# Patient Record
Sex: Male | Born: 1955 | Race: White | Hispanic: No | Marital: Married | Smoking: Former smoker
Health system: Southern US, Community
[De-identification: ages and names within clinical notes are randomized; demographics above are authoritative.]

## PROBLEM LIST (undated history)

## (undated) DIAGNOSIS — I519 Heart disease, unspecified: Secondary | ICD-10-CM

## (undated) DIAGNOSIS — F419 Anxiety disorder, unspecified: Secondary | ICD-10-CM

## (undated) DIAGNOSIS — R519 Headache, unspecified: Secondary | ICD-10-CM

## (undated) DIAGNOSIS — R51 Headache: Secondary | ICD-10-CM

## (undated) DIAGNOSIS — J45909 Unspecified asthma, uncomplicated: Secondary | ICD-10-CM

## (undated) DIAGNOSIS — F329 Major depressive disorder, single episode, unspecified: Secondary | ICD-10-CM

## (undated) DIAGNOSIS — F32A Depression, unspecified: Secondary | ICD-10-CM

## (undated) DIAGNOSIS — R569 Unspecified convulsions: Secondary | ICD-10-CM

## (undated) DIAGNOSIS — J984 Other disorders of lung: Secondary | ICD-10-CM

## (undated) DIAGNOSIS — I38 Endocarditis, valve unspecified: Secondary | ICD-10-CM

## (undated) DIAGNOSIS — J449 Chronic obstructive pulmonary disease, unspecified: Secondary | ICD-10-CM

## (undated) HISTORY — PX: NO PAST SURGERIES: SHX2092

## (undated) HISTORY — DX: Anxiety disorder, unspecified: F41.9

## (undated) HISTORY — DX: Headache: R51

## (undated) HISTORY — DX: Major depressive disorder, single episode, unspecified: F32.9

## (undated) HISTORY — DX: Depression, unspecified: F32.A

## (undated) HISTORY — DX: Headache, unspecified: R51.9

## (undated) HISTORY — DX: Heart disease, unspecified: I51.9

---

## 2015-05-23 DIAGNOSIS — R569 Unspecified convulsions: Secondary | ICD-10-CM

## 2015-05-23 HISTORY — DX: Unspecified convulsions: R56.9

## 2015-07-05 ENCOUNTER — Observation Stay (HOSPITAL_COMMUNITY): Payer: Medicaid Other

## 2015-07-05 ENCOUNTER — Emergency Department (HOSPITAL_COMMUNITY): Payer: Medicaid Other

## 2015-07-05 ENCOUNTER — Encounter (HOSPITAL_COMMUNITY): Payer: Self-pay | Admitting: *Deleted

## 2015-07-05 ENCOUNTER — Inpatient Hospital Stay (HOSPITAL_COMMUNITY)
Admission: EM | Admit: 2015-07-05 | Discharge: 2015-07-09 | DRG: 871 | Disposition: A | Discharge status: Home / Self Care | Payer: Medicaid Other | Attending: Internal Medicine | Admitting: Internal Medicine

## 2015-07-05 DIAGNOSIS — J9601 Acute respiratory failure with hypoxia: Secondary | ICD-10-CM | POA: Diagnosis present

## 2015-07-05 DIAGNOSIS — F17200 Nicotine dependence, unspecified, uncomplicated: Secondary | ICD-10-CM | POA: Diagnosis present

## 2015-07-05 DIAGNOSIS — Z79899 Other long term (current) drug therapy: Secondary | ICD-10-CM

## 2015-07-05 DIAGNOSIS — R938 Abnormal findings on diagnostic imaging of other specified body structures: Secondary | ICD-10-CM | POA: Diagnosis not present

## 2015-07-05 DIAGNOSIS — E43 Unspecified severe protein-calorie malnutrition: Secondary | ICD-10-CM | POA: Diagnosis present

## 2015-07-05 DIAGNOSIS — J189 Pneumonia, unspecified organism: Secondary | ICD-10-CM | POA: Diagnosis not present

## 2015-07-05 DIAGNOSIS — Z720 Tobacco use: Secondary | ICD-10-CM

## 2015-07-05 DIAGNOSIS — R569 Unspecified convulsions: Secondary | ICD-10-CM

## 2015-07-05 DIAGNOSIS — J449 Chronic obstructive pulmonary disease, unspecified: Secondary | ICD-10-CM | POA: Diagnosis present

## 2015-07-05 DIAGNOSIS — I050 Rheumatic mitral stenosis: Secondary | ICD-10-CM | POA: Diagnosis present

## 2015-07-05 DIAGNOSIS — Z6823 Body mass index (BMI) 23.0-23.9, adult: Secondary | ICD-10-CM

## 2015-07-05 DIAGNOSIS — Z9119 Patient's noncompliance with other medical treatment and regimen: Secondary | ICD-10-CM | POA: Diagnosis present

## 2015-07-05 DIAGNOSIS — R0600 Dyspnea, unspecified: Secondary | ICD-10-CM

## 2015-07-05 DIAGNOSIS — R000 Tachycardia, unspecified: Secondary | ICD-10-CM | POA: Diagnosis present

## 2015-07-05 DIAGNOSIS — Z9889 Other specified postprocedural states: Secondary | ICD-10-CM | POA: Insufficient documentation

## 2015-07-05 DIAGNOSIS — J181 Lobar pneumonia, unspecified organism: Secondary | ICD-10-CM | POA: Diagnosis present

## 2015-07-05 DIAGNOSIS — D72829 Elevated white blood cell count, unspecified: Secondary | ICD-10-CM | POA: Diagnosis present

## 2015-07-05 DIAGNOSIS — Z23 Encounter for immunization: Secondary | ICD-10-CM

## 2015-07-05 DIAGNOSIS — F17210 Nicotine dependence, cigarettes, uncomplicated: Secondary | ICD-10-CM | POA: Diagnosis present

## 2015-07-05 DIAGNOSIS — A419 Sepsis, unspecified organism: Secondary | ICD-10-CM | POA: Diagnosis not present

## 2015-07-05 DIAGNOSIS — Z91199 Patient's noncompliance with other medical treatment and regimen due to unspecified reason: Secondary | ICD-10-CM

## 2015-07-05 DIAGNOSIS — R9389 Abnormal findings on diagnostic imaging of other specified body structures: Secondary | ICD-10-CM | POA: Diagnosis present

## 2015-07-05 DIAGNOSIS — J90 Pleural effusion, not elsewhere classified: Secondary | ICD-10-CM | POA: Diagnosis not present

## 2015-07-05 HISTORY — DX: Unspecified convulsions: R56.9

## 2015-07-05 HISTORY — DX: Chronic obstructive pulmonary disease, unspecified: J44.9

## 2015-07-05 HISTORY — DX: Other disorders of lung: J98.4

## 2015-07-05 HISTORY — DX: Endocarditis, valve unspecified: I38

## 2015-07-05 LAB — ETHANOL

## 2015-07-05 LAB — CBC WITH DIFFERENTIAL/PLATELET
Basophils Absolute: 0 10*3/uL (ref 0.0–0.1)
Basophils Relative: 0 % (ref 0–1)
EOS PCT: 1 % (ref 0–5)
Eosinophils Absolute: 0.1 10*3/uL (ref 0.0–0.7)
HCT: 43 % (ref 39.0–52.0)
HEMOGLOBIN: 15.4 g/dL (ref 13.0–17.0)
LYMPHS ABS: 1.3 10*3/uL (ref 0.7–4.0)
LYMPHS PCT: 10 % — AB (ref 12–46)
MCH: 29.8 pg (ref 26.0–34.0)
MCHC: 35.8 g/dL (ref 30.0–36.0)
MCV: 83.2 fL (ref 78.0–100.0)
Monocytes Absolute: 0.6 10*3/uL (ref 0.1–1.0)
Monocytes Relative: 4 % (ref 3–12)
Neutro Abs: 11.5 10*3/uL — ABNORMAL HIGH (ref 1.7–7.7)
Neutrophils Relative %: 85 % — ABNORMAL HIGH (ref 43–77)
PLATELETS: 217 10*3/uL (ref 150–400)
RBC: 5.17 MIL/uL (ref 4.22–5.81)
RDW: 13.1 % (ref 11.5–15.5)
WBC: 13.5 10*3/uL — AB (ref 4.0–10.5)

## 2015-07-05 LAB — URINE MICROSCOPIC-ADD ON

## 2015-07-05 LAB — COMPREHENSIVE METABOLIC PANEL
ALK PHOS: 71 U/L (ref 38–126)
ALT: 16 U/L — AB (ref 17–63)
AST: 26 U/L (ref 15–41)
Albumin: 3.9 g/dL (ref 3.5–5.0)
Anion gap: 10 (ref 5–15)
BUN: 24 mg/dL — ABNORMAL HIGH (ref 6–20)
CALCIUM: 9.1 mg/dL (ref 8.9–10.3)
CO2: 24 mmol/L (ref 22–32)
CREATININE: 1.03 mg/dL (ref 0.61–1.24)
Chloride: 103 mmol/L (ref 101–111)
Glucose, Bld: 149 mg/dL — ABNORMAL HIGH (ref 65–99)
Potassium: 3.7 mmol/L (ref 3.5–5.1)
Sodium: 137 mmol/L (ref 135–145)
TOTAL PROTEIN: 7.1 g/dL (ref 6.5–8.1)
Total Bilirubin: 0.5 mg/dL (ref 0.3–1.2)

## 2015-07-05 LAB — URINALYSIS, ROUTINE W REFLEX MICROSCOPIC
BILIRUBIN URINE: NEGATIVE
Glucose, UA: NEGATIVE mg/dL
HGB URINE DIPSTICK: NEGATIVE
Ketones, ur: NEGATIVE mg/dL
Leukocytes, UA: NEGATIVE
Nitrite: NEGATIVE
PROTEIN: 30 mg/dL — AB
Specific Gravity, Urine: 1.029 (ref 1.005–1.030)
UROBILINOGEN UA: 0.2 mg/dL (ref 0.0–1.0)
pH: 5.5 (ref 5.0–8.0)

## 2015-07-05 LAB — CK: Total CK: 58 U/L (ref 49–397)

## 2015-07-05 LAB — CBG MONITORING, ED: Glucose-Capillary: 146 mg/dL — ABNORMAL HIGH (ref 65–99)

## 2015-07-05 MED ORDER — ALUM & MAG HYDROXIDE-SIMETH 200-200-20 MG/5ML PO SUSP
30.0000 mL | Freq: Four times a day (QID) | ORAL | Status: DC | PRN
  Administered 2015-07-07 – 2015-07-08 (×2): 30 mL via ORAL
  Filled 2015-07-05 (×2): qty 30

## 2015-07-05 MED ORDER — INFLUENZA VAC SPLIT QUAD 0.5 ML IM SUSY
0.5000 mL | PREFILLED_SYRINGE | INTRAMUSCULAR | Status: AC
  Administered 2015-07-07: 0.5 mL via INTRAMUSCULAR
  Filled 2015-07-05 (×2): qty 0.5

## 2015-07-05 MED ORDER — SODIUM CHLORIDE 0.9 % IV SOLN
1000.0000 mg | Freq: Once | INTRAVENOUS | Status: AC
  Administered 2015-07-05: 1000 mg via INTRAVENOUS
  Filled 2015-07-05 (×2): qty 10

## 2015-07-05 MED ORDER — ENOXAPARIN SODIUM 40 MG/0.4ML ~~LOC~~ SOLN
40.0000 mg | Freq: Every day | SUBCUTANEOUS | Status: DC
  Administered 2015-07-06 – 2015-07-08 (×4): 40 mg via SUBCUTANEOUS
  Filled 2015-07-05 (×4): qty 0.4

## 2015-07-05 MED ORDER — ACETAMINOPHEN 325 MG PO TABS: 650.0000 mg | ORAL_TABLET | Freq: Four times a day (QID) | ORAL | Status: DC | PRN

## 2015-07-05 MED ORDER — LORAZEPAM 2 MG/ML IJ SOLN: 1.0000 mg | INTRAMUSCULAR | Status: DC | PRN

## 2015-07-05 MED ORDER — LEVOFLOXACIN IN D5W 750 MG/150ML IV SOLN
750.0000 mg | Freq: Every day | INTRAVENOUS | Status: DC
  Administered 2015-07-06 – 2015-07-07 (×2): 750 mg via INTRAVENOUS
  Filled 2015-07-05 (×3): qty 150

## 2015-07-05 MED ORDER — ONDANSETRON HCL 4 MG/2ML IJ SOLN: 4.0000 mg | Freq: Three times a day (TID) | INTRAMUSCULAR | Status: DC | PRN

## 2015-07-05 MED ORDER — LEVETIRACETAM 500 MG PO TABS
500.0000 mg | ORAL_TABLET | Freq: Two times a day (BID) | ORAL | Status: DC
  Administered 2015-07-06 – 2015-07-09 (×7): 500 mg via ORAL
  Filled 2015-07-05 (×7): qty 1

## 2015-07-05 MED ORDER — HYDROMORPHONE HCL 1 MG/ML IJ SOLN
0.5000 mg | INTRAMUSCULAR | Status: DC | PRN
  Administered 2015-07-06: 1 mg via INTRAVENOUS
  Filled 2015-07-05: qty 1

## 2015-07-05 MED ORDER — LEVOFLOXACIN IN D5W 750 MG/150ML IV SOLN
750.0000 mg | INTRAVENOUS | Status: AC
  Administered 2015-07-06: 750 mg via INTRAVENOUS
  Filled 2015-07-05: qty 150

## 2015-07-05 MED ORDER — FLUOXETINE HCL 10 MG PO CAPS
10.0000 mg | ORAL_CAPSULE | Freq: Every day | ORAL | Status: DC
  Administered 2015-07-06 – 2015-07-09 (×4): 10 mg via ORAL
  Filled 2015-07-05 (×4): qty 1

## 2015-07-05 MED ORDER — SODIUM CHLORIDE 0.9 % IV SOLN
INTRAVENOUS | Status: DC
  Administered 2015-07-06 (×2): via INTRAVENOUS

## 2015-07-05 MED ORDER — ACETAMINOPHEN 650 MG RE SUPP: 650.0000 mg | Freq: Four times a day (QID) | RECTAL | Status: DC | PRN

## 2015-07-05 MED ORDER — SODIUM CHLORIDE 0.9 % IJ SOLN
3.0000 mL | Freq: Two times a day (BID) | INTRAMUSCULAR | Status: DC
  Administered 2015-07-05 – 2015-07-09 (×6): 3 mL via INTRAVENOUS

## 2015-07-05 MED ORDER — LEVOFLOXACIN IN D5W 750 MG/150ML IV SOLN: 750.0000 mg | INTRAVENOUS | Status: DC

## 2015-07-05 MED ORDER — OXYCODONE HCL 5 MG PO TABS
5.0000 mg | ORAL_TABLET | ORAL | Status: DC | PRN
  Administered 2015-07-06: 5 mg via ORAL
  Filled 2015-07-05: qty 1

## 2015-07-05 MED ORDER — SODIUM CHLORIDE 0.9 % IV BOLUS (SEPSIS)
1000.0000 mL | Freq: Once | INTRAVENOUS | Status: AC
  Administered 2015-07-05: 1000 mL via INTRAVENOUS

## 2015-07-05 MED ORDER — ONDANSETRON HCL 4 MG PO TABS: 4.0000 mg | ORAL_TABLET | Freq: Four times a day (QID) | ORAL | Status: DC | PRN

## 2015-07-05 MED ORDER — NICOTINE 14 MG/24HR TD PT24
14.0000 mg | MEDICATED_PATCH | Freq: Every day | TRANSDERMAL | Status: DC
  Administered 2015-07-06 – 2015-07-09 (×5): 14 mg via TRANSDERMAL
  Filled 2015-07-05 (×5): qty 1

## 2015-07-05 MED ORDER — IPRATROPIUM-ALBUTEROL 0.5-2.5 (3) MG/3ML IN SOLN: 3.0000 mL | RESPIRATORY_TRACT | Status: DC | PRN

## 2015-07-05 MED ORDER — ONDANSETRON HCL 4 MG/2ML IJ SOLN: 4.0000 mg | Freq: Four times a day (QID) | INTRAMUSCULAR | Status: DC | PRN

## 2015-07-05 MED ORDER — IOHEXOL 300 MG/ML  SOLN
75.0000 mL | Freq: Once | INTRAMUSCULAR | Status: AC | PRN
  Administered 2015-07-05: 75 mL via INTRAVENOUS

## 2015-07-05 NOTE — Progress Notes (Addendum)
[facility] consulted for medication assistance.  Patient listed as having Medicaid insurance.  PCP listed on patient's insurance card is [facility] in [place] [phone].  Patient's medications should cost three dollars or less.

## 2015-07-05 NOTE — ED Notes (Signed)
Per ems pt hx of seizures, brother denies pt drug use, reports cigarette use. Brother reported pt had seizure like activity x30 min. When ems arrived, pt cyanotic with rapid shallow breathing. 86% on room air. 98% on 2 L [place]. 18 g R AC. NS infused.   Upon rn assessment, pt alert and oriented x4. Denies pain. No tongue injury or incontinence noted.   Hx of seizures, COPD, and aorta defect. Pt from out of town.
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
## 2015-07-05 NOTE — ED Notes (Signed)
Bed: RESA Expected date:  Expected time:  Means of arrival:  Comments: EMS/50s/seizure/post-ictal

## 2015-07-05 NOTE — ED Notes (Signed)
Add on CK

## 2015-07-05 NOTE — ED Notes (Signed)
md at bedside

## 2015-07-05 NOTE — H&P (Addendum)
[facility] Admission History and Physical       Larence Thone XLK:[id number] DOB: 1955/11/17 DOA: 07/05/2015  Referring physician: EDP PCP: PROVIDER NOT IN SYSTEM  Specialists:   Chief Complaint: Seizure  HPI: George Smith is a 59 y.o. male with a history of Seizures Diagnosed 09/2014, history of COPD, Valvular Heart Disease,and Tobacco Use who was brought to the Ed due to tonic clonic activity with loss of consciousness lasting 1 hour witnessed by family.  EMS was called and while in the ED he was very confused but slowly returned to his baseline.   He was at his brother's house when it happened.   He has not taken his Keppra Rx on a regular basis.  He reports that his last Seizure occurred in 05/2015.   He reports that he has an aur before he has a Seizure in which he begins to feel hot all over. He was loaded with IV Keppra and further evaluated.   He was found on chest X-ray to have a RLL effusion.   During his hospitalization in [place] in 09/2014, he was documented to have a moderate sized Right sided Pleural Effusion and was advised to follow up with his PCP after discharge but he did not.   He denies any SOB, and he does smoke 1 pack of cigarettes daily.      A CT scan of the Chest revealed a Moderatedly large partially loculated Effusion with RML consolidiation.   He was placed on IV Levaquin for A CAP pneumonia and referred for admission  Review of Systems:  Constitutional: No Weight Loss, No Weight Gain, Night Sweats, Fevers, Chills, Dizziness, Light Headedness, Fatigue, or Generalized Weakness HEENT: No Headaches, Difficulty Swallowing,Tooth/Dental Problems,Sore Throat,  No Sneezing, Rhinitis, Ear Ache, Nasal Congestion, or Post Nasal Drip,  Cardio-vascular:  No Chest pain, Orthopnea, PND, Edema in Lower Extremities, Anasarca, Dizziness, Palpitations  Resp: No Dyspnea, No DOE, No Productive Cough, No Non-Productive Cough, No Hemoptysis, No Wheezing.    GI: No  Heartburn, Indigestion, Abdominal Pain, Nausea, Vomiting, Diarrhea, Constipation, Hematemesis, Hematochezia, Melena, Change in Bowel Habits,  Loss of Appetite  GU: No Dysuria, No Change in Color of Urine, No Urgency or Urinary Frequency, No Flank pain.  Musculoskeletal: No Joint Pain or Swelling, No Decreased Range of Motion, No Back Pain.  Neurologic: No Syncope, +Seizures, Muscle Weakness, Paresthesia, Vision Disturbance or Loss, No Diplopia, No Vertigo, No Difficulty Walking,  Skin: No Rash or Lesions. Psych: No Change in Mood or Affect, No Depression or Anxiety, No Memory loss, No Confusion, or Hallucinations   Past Medical History  Diagnosis Date  . COPD (chronic obstructive pulmonary disease)   . Seizures   . 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 Lung disease     spots on lungs that have not been checked on     Past Surgical History  Procedure Laterality Date  . No past surgeries        Prior to Admission medications   Medication Sig Start Date End Date Taking? Authorizing Provider  FLUoxetine (PROZAC) 10 MG capsule Take 10 mg by mouth daily.   Yes Historical Provider, MD  furosemide (LASIX) 40 MG tablet Take 40 mg by mouth daily.   Yes Historical Provider, MD  levETIRAcetam (KEPPRA) 500 MG tablet Take 500 mg by mouth 2 (two) times daily.   Yes Historical Provider, MD     No Known Allergies  Social History:  reports that he  has been smoking Cigarettes.  He has a 58 pack-year smoking history. He has never used smokeless tobacco. He reports that he uses illicit drugs (Marijuana). He reports that he does not drink alcohol.     History reviewed. No pertinent family history.     Physical Exam:  GEN:  Pleasant Thin Elderly Appearing Unkempt  59 y.o. Caucasian male examined and in no acute distress; cooperative with exam Filed Vitals:   07/05/15 1912 07/05/15 2111 07/05/15 2248 07/05/15 2310  BP: 112/69 94/73 108/93 108/63  Pulse: 98 93 108 103  Temp:   98.1 F (36.7 C)  97.8 F (36.6 C)  TempSrc:  Oral  Oral  Resp: Height:     (1.702 m)  Weight:    67.1 kg (147 lb 14.9 oz)  SpO2: 93% 92% 94% 95%   Blood pressure 108/63, pulse 103, temperature 97.8 F (36.6 C), temperature source Oral, resp. rate 23, height  (1.702 m), weight 67.1 kg (147 lb 14.9 oz), SpO2 95 %. PSYCH: He is alert and oriented x4; does not appear anxious does not appear depressed; affect is normal HEENT: Normocephalic and Atraumatic, Mucous membranes pink; PERRLA; EOM intact; Fundi:  Benign;  No scleral icterus, Nares: Patent, Oropharynx: Clear, Poor Dentition,    Neck:  FROM, No Cervical Lymphadenopathy nor Thyromegaly or Carotid Bruit; No JVD; Breasts:: Not examined CHEST WALL: No tenderness CHEST: Normal respiration, clear to auscultation bilaterally HEART: Regular rate and rhythm; no murmurs rubs or gallops BACK: No kyphosis or scoliosis; No CVA tenderness ABDOMEN: Positive Bowel Sounds, Scaphoid, Soft Non-Tender, No Rebound or Guarding; No Masses, No Organomegaly Rectal Exam: Not done EXTREMITIES: No Cyanosis, Clubbing, or Edema; No Ulcerations. Genitalia: not examined PULSES: 2+ and symmetric SKIN: Normal hydration no rash or ulceration CNS:  Alert and Oriented x 4, No Focal Deficits Vascular: pulses palpable throughout    Labs on Admission:  Basic Metabolic Panel:  Recent Labs Lab 07/05/15 2000  NA 137  K 3.7  CL 103  CO2 24  GLUCOSE 149*  BUN 24*  CREATININE 1.03  CALCIUM 9.1   Liver Function Tests:  Recent Labs Lab 07/05/15 2000  AST 26  ALT 16*  ALKPHOS 71  BILITOT 0.5  PROT 7.1  ALBUMIN 3.9   No results for input(s): LIPASE, AMYLASE in the last 168 hours. No results for input(s): AMMONIA in the last 168 hours. CBC:  Recent Labs Lab 07/05/15 2000  WBC 13.5*  NEUTROABS 11.5*  HGB 15.4  HCT 43.0  MCV 83.2  PLT 217   Cardiac Enzymes:  Recent Labs Lab 07/05/15 2000  CKTOTAL 58    BNP (last 3  results) No results for input(s): BNP in the last 8760 hours.  ProBNP (last 3 results) No results for input(s): PROBNP in the last 8760 hours.  CBG:  Recent Labs Lab 07/05/15 1930  GLUCAP 146*    Radiological Exams on Admission: Ct Chest W Contrast  07/05/2015   CLINICAL DATA:  Seizure activity earlier today lasting 30 minutes, patient became cyanotic and hypoxic to 86%, patient gives history of fluid around lungs and heart  EXAM: CT CHEST WITH CONTRAST  TECHNIQUE: Multidetector CT imaging of the chest was performed during intravenous contrast administration.  CONTRAST:  75mL OMNIPAQUE IOHEXOL 300 MG/ML  SOLN  COMPARISON:  07/05/2015 chest radiograph  FINDINGS: Mediastinum/Nodes: No significant thyroid abnormalities. Numerous mediastinal lymph nodes. The largest superior mediastinal lymph node is 8 mm in short axis. The largest  precarinal lymph node is 9 mm in short axis. Smaller precarinal and aortopulmonary window lymph nodes are present. There is a 10 mm right hilar lymph node.  There is trace pericardial effusion. There is heavy calcification of the mitral valve. There is mild coronary artery calcification. There is mild thoracic aorta calcification.  Lungs/Pleura: There is a moderately large and partially loculated right pleural effusion. Subpleural consolidation anterior right middle lobe and right lung base, moderately extensive, consistent with atelectasis. Multiple right subpleural tiny nodules measuring about 2 mm, likely representing pulmonary lymph nodes. Multiple similar nodules on the left in the upper and lower lobe. The left lung is otherwise clear.  Upper abdomen: 6 mm cyst in the dome of the liver. Limited images of the upper abdomen otherwise unremarkable.  Musculoskeletal: No acute findings  IMPRESSION: Moderately large partially loculated right pleural effusion associated with middle and lower lobe consolidation that certainly includes atelectasis, although pneumonia in the right  lower lobe cannot be entirely excluded.  Numerous subpleural tiny pulmonary nodules bilaterally most consistent with pulmonary lymph nodes. If the patient is at high risk for bronchogenic carcinoma, follow-up chest CT at 1 year is recommended. If the patient is at low risk, no follow-up is needed. This recommendation follows the consensus statement: Guidelines for Management of Small Pulmonary Nodules Detected on CT Scans: A Statement from the Fleischner Society as published in Radiology 2005; 237:395-400.  Numerous mediastinal lymph nodes borderline in size with mildly enlarged right hilar lymph node.These are likely reactive but follow up CT in several months should be considered.   Electronically Signed   By: Esperanza Heir M.D.   On: 07/05/2015 22:00   Dg Chest Port 1 View  07/05/2015   CLINICAL DATA:  Post seizure.  EXAM: PORTABLE CHEST - 1 VIEW  COMPARISON:  None.  FINDINGS: Lungs are adequately inflated with right base opacification likely a small effusion with associated atelectasis, although cannot exclude infection in the right base. Mild cardiomegaly. Degenerative change of the spine.  IMPRESSION: Right base opacification likely small effusion with associated atelectasis, although cannot exclude infection in the right base.   Electronically Signed   By: Elberta Fortis M.D.   On: 07/05/2015 20:06     EKG: Independently reviewed. Normal sinus Rhythm with S-T depression in the Anterolateral leads  Rate = 99    Assessment/Plan:      59 y.o. male with  Principal Problem:   1.    Seizure   Cardiac Monitoring   Seizure Precautions   Loaded with IV Keppra in ED and then to     Resume Keppra 500mg  PO BID   PRN IV Ativan for Seizure activity   EEG in AM      Active Problems:   2.    Abnormal chest x-ray/ Pleural effusion- CT Chest revealed a Moderatedly large partially loculated Effusion with RML consoldiation   Please Consult Pulmonary or IR in AM     3.     CAP   IV Levaquin   CAP  Protoocol    4.    COPD (chronic obstructive pulmonary disease)   DUONebs PRN   [place] O2 sats     4.    Leukocytosis- due to Post Obstructive PNA   Monitor Trend       5.    Tobacco Use Disorder   Nicotine Patch daily   Counseled to decrease and quit     6.    Noncompliance   Case Management consultation  7.    DVT Prophylaxis   Lovenox     Code Status:     FULL CODE       Family Communication:   Family at Bedside     Disposition Plan:    Inpatient Status        Time spent:   7 Minutes      Ron Parker [facility] Pager [phone]   If 7AM -7PM Please Contact the Day Rounding Team MD for [facility]  If 7PM-7AM, Please Contact Night-Floor Coverage  [email or web address] Password [email or web address] 07/05/2015, 11:12 PM     ADDENDUM:   Patient was seen and examined on 07/05/2015

## 2015-07-06 ENCOUNTER — Inpatient Hospital Stay (HOSPITAL_COMMUNITY): Payer: Medicaid Other

## 2015-07-06 ENCOUNTER — Observation Stay (HOSPITAL_COMMUNITY)
Admit: 2015-07-06 | Discharge: 2015-07-06 | Disposition: A | Discharge status: Home / Self Care | Payer: Medicaid Other | Attending: Internal Medicine | Admitting: Internal Medicine

## 2015-07-06 DIAGNOSIS — E43 Unspecified severe protein-calorie malnutrition: Secondary | ICD-10-CM | POA: Diagnosis present

## 2015-07-06 DIAGNOSIS — J189 Pneumonia, unspecified organism: Secondary | ICD-10-CM | POA: Diagnosis not present

## 2015-07-06 DIAGNOSIS — D72829 Elevated white blood cell count, unspecified: Secondary | ICD-10-CM | POA: Diagnosis not present

## 2015-07-06 DIAGNOSIS — J9601 Acute respiratory failure with hypoxia: Secondary | ICD-10-CM | POA: Diagnosis present

## 2015-07-06 DIAGNOSIS — J90 Pleural effusion, not elsewhere classified: Secondary | ICD-10-CM | POA: Diagnosis present

## 2015-07-06 DIAGNOSIS — Z6823 Body mass index (BMI) 23.0-23.9, adult: Secondary | ICD-10-CM | POA: Diagnosis not present

## 2015-07-06 DIAGNOSIS — A419 Sepsis, unspecified organism: Secondary | ICD-10-CM | POA: Diagnosis present

## 2015-07-06 DIAGNOSIS — F17200 Nicotine dependence, unspecified, uncomplicated: Secondary | ICD-10-CM | POA: Diagnosis present

## 2015-07-06 DIAGNOSIS — Z79899 Other long term (current) drug therapy: Secondary | ICD-10-CM | POA: Diagnosis not present

## 2015-07-06 DIAGNOSIS — J181 Lobar pneumonia, unspecified organism: Secondary | ICD-10-CM | POA: Diagnosis present

## 2015-07-06 DIAGNOSIS — F17210 Nicotine dependence, cigarettes, uncomplicated: Secondary | ICD-10-CM | POA: Diagnosis present

## 2015-07-06 DIAGNOSIS — R569 Unspecified convulsions: Secondary | ICD-10-CM | POA: Diagnosis present

## 2015-07-06 DIAGNOSIS — Z9119 Patient's noncompliance with other medical treatment and regimen: Secondary | ICD-10-CM | POA: Diagnosis present

## 2015-07-06 DIAGNOSIS — J449 Chronic obstructive pulmonary disease, unspecified: Secondary | ICD-10-CM | POA: Diagnosis present

## 2015-07-06 DIAGNOSIS — R938 Abnormal findings on diagnostic imaging of other specified body structures: Secondary | ICD-10-CM | POA: Diagnosis not present

## 2015-07-06 DIAGNOSIS — R000 Tachycardia, unspecified: Secondary | ICD-10-CM | POA: Diagnosis present

## 2015-07-06 DIAGNOSIS — I050 Rheumatic mitral stenosis: Secondary | ICD-10-CM | POA: Diagnosis present

## 2015-07-06 DIAGNOSIS — Z23 Encounter for immunization: Secondary | ICD-10-CM | POA: Diagnosis not present

## 2015-07-06 LAB — CBC
HEMATOCRIT: 38.8 % — AB (ref 39.0–52.0)
HEMOGLOBIN: 13.5 g/dL (ref 13.0–17.0)
MCH: 29.5 pg (ref 26.0–34.0)
MCHC: 34.8 g/dL (ref 30.0–36.0)
MCV: 84.9 fL (ref 78.0–100.0)
Platelets: 214 10*3/uL (ref 150–400)
RBC: 4.57 MIL/uL (ref 4.22–5.81)
RDW: 13.5 % (ref 11.5–15.5)
WBC: 9 10*3/uL (ref 4.0–10.5)

## 2015-07-06 LAB — GLUCOSE, SEROUS FLUID: GLUCOSE FL: 120 mg/dL

## 2015-07-06 LAB — BASIC METABOLIC PANEL
ANION GAP: 6 (ref 5–15)
BUN: 23 mg/dL — ABNORMAL HIGH (ref 6–20)
CHLORIDE: 108 mmol/L (ref 101–111)
CO2: 25 mmol/L (ref 22–32)
Calcium: 8.3 mg/dL — ABNORMAL LOW (ref 8.9–10.3)
Creatinine, Ser: 0.9 mg/dL (ref 0.61–1.24)
GFR calc Af Amer: >60 mL/min (ref >60)
Glucose, Bld: 116 mg/dL — ABNORMAL HIGH (ref 65–99)
POTASSIUM: 4 mmol/L (ref 3.5–5.1)
SODIUM: 139 mmol/L (ref 135–145)

## 2015-07-06 LAB — GRAM STAIN

## 2015-07-06 LAB — BODY FLUID CELL COUNT WITH DIFFERENTIAL
LYMPHS FL: 26 %
MONOCYTE-MACROPHAGE-SEROUS FLUID: 69 % (ref 50–90)
NEUTROPHIL FLUID: 5 % (ref 0–25)
Total Nucleated Cell Count, Fluid: 168 cu mm (ref 0–1000)

## 2015-07-06 LAB — LACTATE DEHYDROGENASE, PLEURAL OR PERITONEAL FLUID: LD FL: 71 U/L — AB (ref 3–23)

## 2015-07-06 LAB — PROTEIN, BODY FLUID

## 2015-07-06 LAB — STREP PNEUMONIAE URINARY ANTIGEN: STREP PNEUMO URINARY ANTIGEN: NEGATIVE

## 2015-07-06 LAB — HIV ANTIBODY (ROUTINE TESTING W REFLEX): HIV SCREEN 4TH GENERATION: NONREACTIVE

## 2015-07-06 MED ORDER — BOOST / RESOURCE BREEZE PO LIQD
1.0000 | Freq: Two times a day (BID) | ORAL | Status: DC
  Administered 2015-07-06 – 2015-07-09 (×7): 1 via ORAL

## 2015-07-06 MED ORDER — SODIUM CHLORIDE 0.9 % IV BOLUS (SEPSIS)
1000.0000 mL | Freq: Once | INTRAVENOUS | Status: AC
  Administered 2015-07-06: 1000 mL via INTRAVENOUS

## 2015-07-06 MED ORDER — SODIUM CHLORIDE 0.9 % IV SOLN
INTRAVENOUS | Status: DC
Start: 2015-07-06 — End: 2015-07-07
  Administered 2015-07-06 – 2015-07-07 (×3): via INTRAVENOUS

## 2015-07-06 MED ORDER — SODIUM CHLORIDE 0.9 % IV BOLUS (SEPSIS)
500.0000 mL | Freq: Once | INTRAVENOUS | Status: AC
  Administered 2015-07-06: 500 mL via INTRAVENOUS

## 2015-07-06 NOTE — Care Management Note (Signed)
Case Management Note  Patient Details  Name: George Smith MRN: [id number] Date of Birth: [birth year]  Subjective/Objective:  Referral for med asst, & pcp. Per ED CM note-has pcp-[facility].co pay $3, per Unable to asst w/meds since he has medicaid, has a Counsellor.Patient became very upset that he doesn't have money to pay for his meds,she started using bad language.  I provided w/resources, & left the rm.Informed MD/CSW/Nsg updated. Will contact [facility] to see if they might be able to asst, if he will let them.                 Action/Plan:d/c plan home.   Expected Discharge Date:   (unknown)               Expected Discharge Plan:  Home/Self Care  In-House Referral:     Discharge planning Services  CM Consult, Medication Assistance (Has medicaid-co pay $3)  Post Acute Care Choice:    Choice offered to:     DME Arranged:    DME Agency:     HH Arranged:    HH Agency:     Status of Service:  In process, will continue to follow  Medicare Important Message Given:    Date Medicare IM Given:    Medicare IM give by:    Date Additional Medicare IM Given:    Additional Medicare Important Message give by:     If discussed at Long Length of Stay Meetings, dates discussed:    Additional Comments:  Lanier Clam, RN 07/06/2015, 11:14 AM

## 2015-07-06 NOTE — Care Management Note (Signed)
Case Management Note  Patient Details  Name: George Smith MRN: [id number] Date of Birth: 29-Oct-1955  Subjective/Objective:  Spoke to patient again about resources.  Patient now is allowing me to asst, w/resources.  Will talk to his wife this afternoon & let me know.  I have contacted [facility] Shanda Bumps who will also await the outcome if patient agrees to changing Dr offices to [facility] where he can get more asst w/pcp, pharamcy, & resources.                  Action/Plan:d/c plan home.   Expected Discharge Date:   (unknown)               Expected Discharge Plan:  Home/Self Care  In-House Referral:     Discharge planning Services  CM Consult, Medication Assistance (Has medicaid-co pay $3)  Post Acute Care Choice:    Choice offered to:     DME Arranged:    DME Agency:     HH Arranged:    HH Agency:     Status of Service:  In process, will continue to follow  Medicare Important Message Given:    Date Medicare IM Given:    Medicare IM give by:    Date Additional Medicare IM Given:    Additional Medicare Important Message give by:     If discussed at Long Length of Stay Meetings, dates discussed:    Additional Comments:  Lanier Clam, RN 07/06/2015, 12:36 PM

## 2015-07-06 NOTE — Progress Notes (Signed)
Patient had a BP of 81/49  Contacted Practitioner Craige Cotta rechecked BP was 79/60. Order placed for NS bolus .

## 2015-07-06 NOTE — Progress Notes (Addendum)
Patient ID: George Smith, male   DOB: 1956-07-11, 59 y.o.   MRN: [id number]  [facility] PROGRESS NOTE  Nuno Brubacher RPR:[id number] DOB: 12/27/1955 DOA: 07/05/2015 PCP: PROVIDER NOT IN SYSTEM   Brief narrative:    59 y.o. male with a history of seizures diagnosed 09/2014, history of COPD, Valvular Heart Disease,and Tobacco Use who was brought to the ED due to tonic clonic activity with loss of consciousness lasting 1 hour witnessed by family. EMS was called and while in the ED he was very confused but slowly returned to his baseline. Pt is on Keppra at home but has not been compliant with medications. In ED, CXR was notable for RLL effusion which was apparently present since December 2012 and patient has been hospitalized in [place] at that time. He was advised to follow-up with PCP and to stop smoking. Patient however continues to smoke 1 pack of cigarettes per day. CT chest confirmed moderately large loculated effusion on the right side with right middle lobe consolidation. Patient was started on Levaquin and was referred for an admission.  Assessment/Plan:    Principal Problem:   Seizure, acute on chronic - Secondary to medical noncompliance - EEG done, no signs of seizure activity per EEG - Continue Keppra as per home medical regimen - Keep on seizure precautions  Active Problems:   Sepsis secondary to right middle and lower lobe pneumonia - Patient met criteria for sepsis with WBC 13.5, RR up to 26, HR up to 108, source pneumonia - WBC now within normal limits - Continue Levaquin day #2/7 - HIV screen requested     Acute hypoxic respiratory failure - With initial oxygen saturation 86% on room air - This is most likely secondary to right pleural effusion with right middle/lower lobe pneumonia imposed on acute COPD - Continue Levaquin day 2 - Consulted IR to perform thoracentesis, plan on sending fluid for analysis    Lobar pneumonia, RML/RLL, CAP, unknown pathogen at this  time - Follow up on sputum culture, urine Legionella and strep pneumo - Treatment with Levaquin as noted above    Right moderate to large loculated effusion - Appreciate IR assistance for thoracentesis - Again, send for fluid analysis   Acute on chronic COPD (chronic obstructive pulmonary disease) - No wheezing on exam this morning - Continue broncho-dilators scheduled and as needed, continue Levaquin as noted above    Medical non compliance - discussed with pt importance of taking medications as prescribed to avoid potential complications such as seizures  - SW consult      Numerous subpleural pulmonary nodules bilaterally  - most consistent with pulmonary lymph nodes - follow-up chest CT at 1 year is recommended as pt is at high risk for lung cancer given long history of smoking - this was discussed with pt in detail     Tobacco abuse - Smoking cessation discussed in detail and patient verbalized understanding - Provide nicotine patch if patient desires    Protein-calorie malnutrition, severe - Advance diet as patient able to tolerate - Appreciate nutritionist assistance    Acute nausea and vomiting - unclear etiology at this time - allow antiemetics as needed  - continue IVF NS at 75 cc/hr  DVT prophylaxis - Lovenox SQ  Code Status: Full.  Family Communication:  plan of care discussed with the patient Disposition Plan: Home when stable.   IV access:  Peripheral IV  Procedures and diagnostic studies:    Ct Chest W Contrast 07/05/2015  Moderately large partially  loculated right pleural effusion associated with middle and lower lobe consolidation that certainly includes atelectasis, although pneumonia in the right lower lobe cannot be entirely excluded.  Numerous subpleural tiny pulmonary nodules bilaterally most consistent with pulmonary lymph nodes. If the patient is at high risk for bronchogenic carcinoma, follow-up chest CT at 1 year is recommended. If the patient is at  low risk, no follow-up is needed. This recommendation follows the consensus statement: Guidelines for Management of Small Pulmonary Nodules Detected on CT Scans: A Statement from the [place] as published in Radiology 2005; 237:395-400.  Numerous mediastinal lymph nodes borderline in size with mildly enlarged right hilar lymph node.These are likely reactive but follow up CT in several months should be considered.     Dg Chest Port 1 View 07/05/2015  Right base opacification likely small effusion with associated atelectasis, although cannot exclude infection in the right base.     Medical Consultants:  IR for thoracentesis   Other Consultants:  None  IAnti-Infectives:   Levaquin 9/13 -->  Faye Ramsay, MD  [facility] Pager [phone]  If 7PM-7AM, please contact night-coverage [email or web address] Password [facility] 07/06/2015, 12:59 PM     HPI/Subjective: No events overnight. Nausea and one episode of non bloody vomiting this AM.    Objective: Filed Vitals:   07/06/15 0300 07/06/15 0500 07/06/15 0857 07/06/15 1030  BP: 78/59 83/61 98/68  91/67  Pulse:  93  100  Temp:  99.1 F (37.3 C)  98.8 F (37.1 C)  TempSrc:  Axillary  Oral  Resp:  20    Height:      Weight:      SpO2:  97%  97%    Intake/Output Summary (Last 24 hours) at 07/06/15 1259 Last data filed at 07/06/15 1227  Gross per 24 hour  Intake  497.5 ml  Output    550 ml  Net  -52.5 ml    Exam:   General:  Pt is alert, follows commands appropriately, not in acute distress  Cardiovascular: Regular rhythm, tachycardic, S1/S2, no murmurs, no rubs, no gallops  Respiratory: Rhonchi on the right side, no wheezing   Abdomen: Soft, non tender, non distended, bowel sounds present, no guarding  Extremities: No edema, pulses DP and PT palpable bilaterally  Neuro: Grossly nonfocal  Data Reviewed: Basic Metabolic Panel:  Recent Labs Lab 07/05/15 2000 07/06/15 0340  NA 137 139  K 3.7 4.0  CL 103 108  CO2 24  25  GLUCOSE 149* 116*  BUN 24* 23*  CREATININE 1.03 0.90  CALCIUM 9.1 8.3*   Liver Function Tests:  Recent Labs Lab 07/05/15 2000  AST 26  ALT 16*  ALKPHOS 71  BILITOT 0.5  PROT 7.1  ALBUMIN 3.9   CBC:  Recent Labs Lab 07/05/15 2000 07/06/15 0340  WBC 13.5* 9.0  NEUTROABS 11.5*  --   HGB 15.4 13.5  HCT 43.0 38.8*  MCV 83.2 84.9  PLT 217 214   Cardiac Enzymes:  Recent Labs Lab 07/05/15 2000  CKTOTAL 58   CBG:  Recent Labs Lab 07/05/15 1930  GLUCAP 146*    Scheduled Meds: . enoxaparin (LOVENOX) injection  40 mg Subcutaneous QHS  . feeding supplement  1 Container Oral BID BM  . FLUoxetine  10 mg Oral Daily  . Influenza vac split quadrivalent PF  0.5 mL Intramuscular Tomorrow-1000  . levETIRAcetam  500 mg Oral BID  . levofloxacin (LEVAQUIN) IV  750 mg Intravenous QHS  . nicotine  14 mg Transdermal Daily  .  sodium chloride  3 mL Intravenous Q12H   Continuous Infusions: . sodium chloride 75 mL/hr at 07/06/15 [phone]

## 2015-07-06 NOTE — Procedures (Signed)
US guided diagnostic/therapeutic right thoracentesis performed yielding 880 cc yellow fluid. The fluid was sent to the lab for preordered studies. F/u CXR pending. No immediate complications.

## 2015-07-06 NOTE — Progress Notes (Signed)
Offsite EEG completed at [facility], results pending. 

## 2015-07-06 NOTE — Procedures (Signed)
EEG report.  Brief clinical history: 59 y.o. male with a history of Seizures diagnosed 09/2014, history of COPD, Valvular Heart Disease,and Tobacco use who was brought to the Ed due to tonic clonic activity with loss of consciousness lasting 1 hour witnessed by family. EMS was called and while in the ED he was very confused but slowly returned to his baseline. He was at his brother's house when it happened. He has not taken his Keppra Rx on a regular basis. He reports that his last seizure occurred in 08/201    Technique: this is a 17 channel routine scalp EEG performed at the bedside with bipolar and monopolar montages arranged in accordance to the international 10/20 system of electrode placement. One channel was dedicated to EKG recording.  The study was performed during wakefulness, drowsiness, and stage 2 sleep. No activating procedures performed.  Description:In the wakeful state, the best background consisted of a medium amplitude, posterior dominant, well sustained, symmetric and reactive 8 Hz rhythm. Drowsiness demonstrated dropout of the alpha rhythm. Stage 2 sleep showed symmetric and synchronous sleep spindles without intermixed epileptiform discharges. No focal or generalized epileptiform discharges noted.  No pathologic areas of slowing seen.  EKG showed sinus rhythm.  Impression: this is a normal awake and asleep EEG. Please, be aware that a normal EEG does not exclude the possibility of epilepsy.  Clinical correlation is advised.   Wyatt Portela, MD [facility]

## 2015-07-06 NOTE — Progress Notes (Signed)
Initial Nutrition Assessment  DOCUMENTATION CODES:   Severe malnutrition in context of chronic illness  INTERVENTION:  - Will order Boost Breeze BID, each supplement provides 250 kcal and 9 grams of protein - Encourage pt to order bland foods - RD will continue to monitor for needs  NUTRITION DIAGNOSIS:   Inadequate oral intake related to nausea, vomiting as evidenced by per patient/family report.  GOAL:   Patient will meet greater than or equal to 90% of their needs  MONITOR:   PO intake, Supplement acceptance, Weight trends, Labs, I & O's  REASON FOR ASSESSMENT:   Malnutrition Screening Tool  ASSESSMENT:   59 y.o. male with a history of Seizures Diagnosed 09/2014, history of COPD, Valvular Heart Disease,and Tobacco Use who was brought to the Ed due to tonic clonic activity with loss of consciousness lasting 1 hour witnessed by family. EMS was called and while in the ED he was very confused but slowly returned to his baseline. He was at his brother's house when it happened. He has not taken his Keppra Rx on a regular basis. He reports that his last Seizure occurred in 05/2015. He reports that he has an aur before he has a Seizure in which he begins to feel hot all over. He was loaded with IV Keppra and further evaluated. He was found on chest X-ray to have a RLL effusion. During his hospitalization in [place] in 09/2014, he was documented to have a moderate sized Right sided Pleural Effusion and was advised to follow up with his PCP after discharge but he did not.  Pt seen for MST. BMI indicates normal weight status. He did not have anything for breakfast this AM due to emesis this AM from dinner last night. Pt reports he has had issues with heartburn x2-3 months and has associated nausea and vomiting related to this. He states that acidic foods and greasy foods make it worse. He experiences vomiting with most intakes but states that he is able to tolerate  bland foods such as toast.  He states for dinner last night he had a hamburger although this has caused nausea and vomiting in the past. Encouraged pt to chose bland foods for meals and avoid foods he knows causes discomfort but pt does not seem interested in this.   No previous weight recordings available in the chart; pt reports that UBW is 200 lbs and he last weighed this 3 months ago. This would indicate 53 lb weight loss (26.5% body weight) in the past 3 months which is significant for time frame. Severe muscle and fat wasting noted.   Pt upset about information provided by Case Manager regarding him needing to pay a copay for medications. He indicates this is not possible for him. Offered sympathy to pt but unable to assist with this area.  Not meeting needs. Will trial Boost Breeze. Medications reviewed. Labs reviewed; BUN elevated, Ca: 8.3 mg/dL.  Diet Order:  Diet Heart Room service appropriate?: Yes; Fluid consistency:: Thin  Skin:  Reviewed, no issues  Last BM:  9/12  Height:   Ht Readings from Last 1 Encounters:  07/05/15  (1.702 m)    Weight:   Wt Readings from Last 1 Encounters:  07/05/15 147 lb 14.9 oz (67.1 kg)    Ideal Body Weight:  67.27 kg (kg)  BMI:  Body mass index is 23.16 kg/(m^2).  Estimated Nutritional Needs:   Kcal:  1700-1900  Protein:  70-80 grams  Fluid:  2.5 L/day  EDUCATION NEEDS:   No education needs identified at this time     Jarome Matin, [place], [facility] Inpatient Clinical Dietitian Pager # [phone] After hours/weekend pager # [phone]

## 2015-07-07 ENCOUNTER — Other Ambulatory Visit: Payer: Self-pay

## 2015-07-07 LAB — CBC
HCT: 38.1 % — ABNORMAL LOW (ref 39.0–52.0)
Hemoglobin: 12.9 g/dL — ABNORMAL LOW (ref 13.0–17.0)
MCH: 28.6 pg (ref 26.0–34.0)
MCHC: 33.9 g/dL (ref 30.0–36.0)
MCV: 84.5 fL (ref 78.0–100.0)
Platelets: 197 K/uL (ref 150–400)
RBC: 4.51 MIL/uL (ref 4.22–5.81)
RDW: 13.5 % (ref 11.5–15.5)
WBC: 10.4 K/uL (ref 4.0–10.5)

## 2015-07-07 LAB — BASIC METABOLIC PANEL
Anion gap: 6 (ref 5–15)
BUN: 17 mg/dL (ref 6–20)
CALCIUM: 8.4 mg/dL — AB (ref 8.9–10.3)
CO2: 25 mmol/L (ref 22–32)
CREATININE: 0.79 mg/dL (ref 0.61–1.24)
Chloride: 108 mmol/L (ref 101–111)
GFR calc non Af Amer: >60 mL/min (ref >60)
GLUCOSE: 95 mg/dL (ref 65–99)
Potassium: 3.5 mmol/L (ref 3.5–5.1)
Sodium: 139 mmol/L (ref 135–145)

## 2015-07-07 LAB — LEGIONELLA ANTIGEN, URINE

## 2015-07-07 LAB — PH, BODY FLUID: pH, Body Fluid: 7.7

## 2015-07-07 MED ORDER — METOPROLOL TARTRATE 1 MG/ML IV SOLN
5.0000 mg | Freq: Four times a day (QID) | INTRAVENOUS | Status: DC | PRN
  Administered 2015-07-07: 5 mg via INTRAVENOUS
  Filled 2015-07-07: qty 5

## 2015-07-07 MED ORDER — GI COCKTAIL ~~LOC~~
30.0000 mL | Freq: Three times a day (TID) | ORAL | Status: DC | PRN
  Filled 2015-07-07: qty 30

## 2015-07-07 NOTE — Progress Notes (Addendum)
Pt HR 130 sustained and regular. Pt denies chest pain but complains of indigestion. Dr. Izola Price informed, metoprolol 5 mg IV ordered PRN . Will continue to monitor.  Thank you for your help. Debbora Presto, MD  [facility] Pager [phone]  If 7PM-7AM, please contact night-coverage [email or web address] Password [email or web address]

## 2015-07-07 NOTE — Progress Notes (Signed)
Patient ID: George Smith, male   DOB: 09/18/1956, 59 y.o.   MRN: [id number]  [facility] PROGRESS NOTE  George Smith EXB:[id number] DOB: November 04, 1955 DOA: 07/05/2015 PCP: PROVIDER NOT IN SYSTEM   Brief narrative:    59 y.o. male with a history of seizures diagnosed 09/2014, history of COPD, Valvular Heart Disease,and Tobacco Use who was brought to the ED due to tonic clonic activity with loss of consciousness lasting 1 hour witnessed by family. EMS was called and while in the ED he was very confused but slowly returned to his baseline. Pt is on Keppra at home but has not been compliant with medications. In ED, CXR was notable for RLL effusion which was apparently present since December 2012 and patient has been hospitalized in [place] at that time. He was advised to follow-up with PCP and to stop smoking. Patient however continues to smoke 1 pack of cigarettes per day. CT chest confirmed moderately large loculated effusion on the right side with right middle lobe consolidation. Patient was started on Levaquin and was referred for an admission.  Assessment/Plan:    Principal Problem:   Seizure, acute on chronic - Secondary to medical noncompliance - EEG done, no signs of seizure activity per EEG - Continue Keppra as per home medical regimen - no seizures while inpatient  - Keep on seizure precautions while hospitalized   Active Problems:   Sepsis secondary to right middle and lower lobe pneumonia - Patient met criteria for sepsis with WBC 13.5, RR up to 26, HR up to 108, source pneumonia - WBC now within normal limits - Continue Levaquin day #3/7 - HIV screen requested and still pending     Acute hypoxic respiratory failure - With initial oxygen saturation 86% on room air - This is most likely secondary to right pleural effusion with right middle/lower lobe pneumonia imposed on acute on chronic COPD - Continue Levaquin day 3 - pt is now s/p right side thoracentesis, post op day #1,  880 cc fluid removed and sent for analysis, studies pending at this time     Lobar pneumonia, RML/RLL, CAP, unknown pathogen at this time - Follow up on sputum culture, urine Legionella and strep pneumo, all currently negative to date  - Treatment with Levaquin as noted above    Right moderate to large loculated effusion - Appreciate IR assistance for thoracentesis, s/p right side thoracenteses, removed 880 cc fluid and sent for analysis    Acute on chronic COPD (chronic obstructive pulmonary disease) - No wheezing on exam this morning - Continue broncho-dilators scheduled and as needed, continue Levaquin as noted above    Medical non compliance - discussed with pt importance of taking medications as prescribed to avoid potential complications such as seizures  - SW consulted, appreciate assistance, pt has medicaid     Numerous subpleural pulmonary nodules bilaterally  - most consistent with pulmonary lymph nodes - follow-up chest CT at 1 year is recommended as pt is at high risk for lung cancer given long history of smoking -  I have again discussed this with pt in detail and he has verbalized understanding     Tobacco abuse - Smoking cessation discussed in detail and patient verbalized understanding - Provide nicotine patch if patient desires    Protein-calorie malnutrition, severe - pt tolerating diet well     Acute nausea and vomiting - unclear etiology at this time - resolved and pt tolerating diet well  DVT prophylaxis - Lovenox SQ  Code Status:  Full.  Family Communication:  plan of care discussed with the patient Disposition Plan: Home when stable.   IV access:  Peripheral IV  Procedures and diagnostic studies:    Ct Chest W Contrast 07/05/2015  Moderately large partially loculated right pleural effusion associated with middle and lower lobe consolidation that certainly includes atelectasis, although pneumonia in the right lower lobe cannot be entirely excluded.   Numerous subpleural tiny pulmonary nodules bilaterally most consistent with pulmonary lymph nodes. If the patient is at high risk for bronchogenic carcinoma, follow-up chest CT at 1 year is recommended. If the patient is at low risk, no follow-up is needed. This recommendation follows the consensus statement: Guidelines for Management of Small Pulmonary Nodules Detected on CT Scans: A Statement from the [place] as published in Radiology 2005; 237:395-400.  Numerous mediastinal lymph nodes borderline in size with mildly enlarged right hilar lymph node.These are likely reactive but follow up CT in several months should be considered.     Dg Chest Port 1 View 07/05/2015  Right base opacification likely small effusion with associated atelectasis, although cannot exclude infection in the right base.     Medical Consultants:  IR for thoracentesis   Other Consultants:  None  IAnti-Infectives:   Levaquin 9/13 -->  Faye Ramsay, MD  [facility] Pager [phone]  If 7PM-7AM, please contact night-coverage [email or web address] Password [email or web address] 07/07/2015, 7:00 AM   LOS: 1 day   HPI/Subjective: No events overnight. Discomfort at the thoracentesis site   Objective: Filed Vitals:   07/06/15 1400 07/06/15 2132 07/07/15 0200 07/07/15 0614  BP: 92/56 137/76 96/71 100/64  Pulse: 111 59 102 103  Temp:  97.8 F (36.6 C) 98.4 F (36.9 C) 97.9 F (36.6 C)  TempSrc:  Oral Oral Oral  Resp:  20 20 20   Height:      Weight:      SpO2: 96% 100% 97% 95%    Intake/Output Summary (Last 24 hours) at 07/07/15 0700 Last data filed at 07/07/15 0615  Gross per 24 hour  Intake 1358.75 ml  Output    900 ml  Net 458.75 ml    Exam:   General:  Pt is alert, follows commands appropriately, not in acute distress  Cardiovascular: Regular rhythm, tachycardic, S1/S2, no murmurs, no rubs, no gallops  Respiratory: diminished breath sounds at bases, no wheezing   Abdomen: Soft, non tender, non distended,  bowel sounds present, no guarding  Extremities: No edema, pulses DP and PT palpable bilaterally  Neuro: Grossly nonfocal  Data Reviewed: Basic Metabolic Panel:  Recent Labs Lab 07/05/15 2000 07/06/15 0340 07/07/15 0437  NA 137 139 139  K 3.7 4.0 3.5  CL 103 108 108  CO2 24 25 25   GLUCOSE 149* 116* 95  BUN 24* 23* 17  CREATININE 1.03 0.90 0.79  CALCIUM 9.1 8.3* 8.4*   Liver Function Tests:  Recent Labs Lab 07/05/15 2000  AST 26  ALT 16*  ALKPHOS 71  BILITOT 0.5  PROT 7.1  ALBUMIN 3.9   CBC:  Recent Labs Lab 07/05/15 2000 07/06/15 0340 07/07/15 0437  WBC 13.5* 9.0 10.4  NEUTROABS 11.5*  --   --   HGB 15.4 13.5 12.9*  HCT 43.0 38.8* 38.1*  MCV 83.2 84.9 84.5  PLT 217 214 197   Cardiac Enzymes:  Recent Labs Lab 07/05/15 2000  CKTOTAL 58   CBG:  Recent Labs Lab 07/05/15 1930  GLUCAP 146*    Scheduled Meds: . enoxaparin (LOVENOX) injection  40 mg Subcutaneous  QHS  . feeding supplement  1 Container Oral BID BM  . FLUoxetine  10 mg Oral Daily  . Influenza vac split quadrivalent PF  0.5 mL Intramuscular Tomorrow-1000  . levETIRAcetam  500 mg Oral BID  . levofloxacin (LEVAQUIN) IV  750 mg Intravenous QHS  . nicotine  14 mg Transdermal Daily  . sodium chloride  3 mL Intravenous Q12H   Continuous Infusions: . sodium chloride 75 mL/hr at 07/07/15 0137

## 2015-07-07 NOTE — Progress Notes (Addendum)
Received page about elevated HR up to 130. Placed order for Metoprolol 5 mg IV Q6 hours as needed for HR > 110. EKG reviewed, sinus tachycardia noted. Continue to monitor on telemetry. Added GI cocktail for indigestion.  Debbora Presto, MD  [facility] Pager [phone]  If 7PM-7AM, please contact night-coverage [email or web address] Password [email or web address]

## 2015-07-08 LAB — CBC
HCT: 38.6 % — ABNORMAL LOW (ref 39.0–52.0)
HEMOGLOBIN: 13.4 g/dL (ref 13.0–17.0)
MCH: 29.1 pg (ref 26.0–34.0)
MCHC: 34.7 g/dL (ref 30.0–36.0)
MCV: 83.9 fL (ref 78.0–100.0)
Platelets: 173 10*3/uL (ref 150–400)
RBC: 4.6 MIL/uL (ref 4.22–5.81)
RDW: 13.4 % (ref 11.5–15.5)
WBC: 10.7 10*3/uL — ABNORMAL HIGH (ref 4.0–10.5)

## 2015-07-08 LAB — BASIC METABOLIC PANEL
Anion gap: 6 (ref 5–15)
BUN: 16 mg/dL (ref 6–20)
CALCIUM: 8.6 mg/dL — AB (ref 8.9–10.3)
CHLORIDE: 107 mmol/L (ref 101–111)
CO2: 25 mmol/L (ref 22–32)
CREATININE: 0.74 mg/dL (ref 0.61–1.24)
Glucose, Bld: 107 mg/dL — ABNORMAL HIGH (ref 65–99)
Potassium: 3.5 mmol/L (ref 3.5–5.1)
SODIUM: 138 mmol/L (ref 135–145)

## 2015-07-08 MED ORDER — LEVOFLOXACIN 750 MG PO TABS
750.0000 mg | ORAL_TABLET | Freq: Every day | ORAL | Status: DC
  Administered 2015-07-08: 750 mg via ORAL
  Filled 2015-07-08: qty 1

## 2015-07-08 MED ORDER — LEVOFLOXACIN 750 MG PO TABS: 750.0000 mg | ORAL_TABLET | Freq: Every day | ORAL | Status: DC

## 2015-07-08 NOTE — Care Management Note (Signed)
Case Management Note  Patient Details  Name: George Smith MRN: [id number] Date of Birth: [birth year]  Subjective/Objective: patient has agree to change pcp to [facility] has set [facility] appt see f/u section. Patient will use [facility] pharmacy for meds, they will asst him w/his financial issue w/not affording his co pay having medicaid.Not appropriate for [facility] program.Patient voiced understanding.                 Action/Plan:d/c plan home.   Expected Discharge Date:   (unknown)               Expected Discharge Plan:  Home/Self Care  In-House Referral:     Discharge planning Services  CM Consult, Medication Assistance, [facility] (Has medicaid-co pay $3)  Post Acute Care Choice:    Choice offered to:     DME Arranged:    DME Agency:     HH Arranged:    HH Agency:     Status of Service:  In process, will continue to follow  Medicare Important Message Given:    Date Medicare IM Given:    Medicare IM give by:    Date Additional Medicare IM Given:    Additional Medicare Important Message give by:     If discussed at Long Length of Stay Meetings, dates discussed:    Additional Comments:  Lanier Clam, RN 07/08/2015, 2:05 PM

## 2015-07-08 NOTE — Progress Notes (Signed)
Pharmacy IV to PO conversion  This patient is receiving LEVOFLOXACIN by the intravenous route. Based on criteria approved by the Pharmacy and Therapeutics Committee, and the Infectious Disease Division, the antibiotic(s) is/are being converted to equivalent oral dose form(s). These criteria include:   Patient being treated for a respiratory tract infection, urinary tract infection, cellulitis, or Clostridium Difficile Associated Diarrhea  The patient is not neutropenic and does not exhibit a GI malabsorption state  The patient is eating (either orally or per tube) and/or has been taking other orally administered medications for at least 24 hours.  The patient is improving clinically (physician assessment and a 24-hour Tmax of <=100.5 F)  If you have any questions about this conversion, please contact the Pharmacy Department (ext [phone]).  Thank you.  Bernadene Person, PharmD Pager: [phone] 07/08/2015, 2:45 PM

## 2015-07-08 NOTE — Progress Notes (Signed)
Patient ID: George Smith, male   DOB: [birth year], 59 y.o.   MRN: [id number]  [facility] PROGRESS NOTE  Jawon Dipiero VEL:[id number] DOB: 19-Apr-1956 DOA: 07/05/2015 PCP: PROVIDER NOT IN SYSTEM   Brief narrative:    59 y.o. male with a history of seizures diagnosed 09/2014, history of COPD, Valvular Heart Disease,and Tobacco Use who was brought to the ED due to tonic clonic activity with loss of consciousness lasting 1 hour witnessed by family. EMS was called and while in the ED he was very confused but slowly returned to his baseline. Pt is on Keppra at home but has not been compliant with medications. In ED, CXR was notable for RLL effusion which was apparently present since December 2012 and patient has been hospitalized in [place] at that time. He was advised to follow-up with PCP and to stop smoking. Patient however continues to smoke 1 pack of cigarettes per day. CT chest confirmed moderately large loculated effusion on the right side with right middle lobe consolidation. Patient was started on Levaquin and was referred for an admission.  Assessment/Plan:    Principal Problem:   Seizure, acute on chronic - Secondary to medical noncompliance - EEG done, no signs of seizure activity per EEG - Continue Keppra as per home medical regimen - no seizures while inpatient   Active Problems:   Sepsis secondary to right middle and lower lobe pneumonia - Patient met criteria for sepsis with WBC 13.5, RR up to 26, HR up to 108, source pneumonia - WBC now within normal limits - Continue Levaquin day #4/7    Acute hypoxic respiratory failure - With initial oxygen saturation 86% on room air - This is most likely secondary to right pleural effusion with right middle/lower lobe pneumonia imposed on acute on chronic COPD - Continue Levaquin day 4 - pt is now s/p right side thoracentesis, post op day #2, 880 cc fluid removed and sent for analysis, studies pending at this time  - CXR repeat in  AM    Lobar pneumonia, RML/RLL, CAP, unknown pathogen at this time - Follow up on sputum culture, urine Legionella and strep pneumo, all currently negative to date  - Treatment with Levaquin as noted above - repeat CXR in AM    Right moderate to large loculated effusion - Appreciate IR assistance for thoracentesis, s/p right side thoracenteses, removed 880 cc fluid and sent for analysis, pending    Acute on chronic COPD (chronic obstructive pulmonary disease) - No wheezing on exam this morning - Continue broncho-dilators scheduled and as needed, continue Levaquin as noted above    Medical non compliance - discussed with pt importance of taking medications as prescribed to avoid potential complications such as seizures  - SW consulted, appreciate assistance, pt has medicaid     Numerous subpleural pulmonary nodules bilaterally  - most consistent with pulmonary lymph nodes - follow-up chest CT at 1 year is recommended as pt is at high risk for lung cancer given long history of smoking - I have again discussed this with pt in detail and he has verbalized understanding     Tobacco abuse - Smoking cessation discussed in detail and patient verbalized understanding    Protein-calorie malnutrition, severe - pt tolerating diet well     Acute nausea and vomiting - unclear etiology at this time - resolved and pt tolerating diet well  DVT prophylaxis - Lovenox SQ  Code Status: Full.  Family Communication:  plan of care discussed with the patient Disposition  Plan: Home in AM   IV access:  Peripheral IV  Procedures and diagnostic studies:    Ct Chest W Contrast 07/05/2015  Moderately large partially loculated right pleural effusion associated with middle and lower lobe consolidation that certainly includes atelectasis, although pneumonia in the right lower lobe cannot be entirely excluded.  Numerous subpleural tiny pulmonary nodules bilaterally most consistent with pulmonary lymph nodes.  If the patient is at high risk for bronchogenic carcinoma, follow-up chest CT at 1 year is recommended. If the patient is at low risk, no follow-up is needed. This recommendation follows the consensus statement: Guidelines for Management of Small Pulmonary Nodules Detected on CT Scans: A Statement from the [employer] as published in Radiology 2005; 237:395-400.  Numerous mediastinal lymph nodes borderline in size with mildly enlarged right hilar lymph node.These are likely reactive but follow up CT in several months should be considered.     Dg Chest Port 1 View 07/05/2015  Right base opacification likely small effusion with associated atelectasis, although cannot exclude infection in the right base.     Medical Consultants:  IR for thoracentesis   Other Consultants:  None  IAnti-Infectives:   Levaquin 9/13 -->  Faye Ramsay, MD  [facility] Pager [phone]  If 7PM-7AM, please contact night-coverage [email or web address] Password [facility] 07/08/2015, 7:49 AM   LOS: 2 days   HPI/Subjective: No events overnight. Discomfort at the thoracentesis site   Objective: Filed Vitals:   07/07/15 1800 07/07/15 1900 07/07/15 2155 07/08/15 0500  BP: 112/82 111/80 117/93 115/81  Pulse: 132 118 118 112  Temp:   97.9 F (36.6 C) 97.6 F (36.4 C)  TempSrc:   Oral Oral  Resp: 19  20 20   Height:      Weight:      SpO2: 94%  95% 94%    Intake/Output Summary (Last 24 hours) at 07/08/15 0749 Last data filed at 07/08/15 0500  Gross per 24 hour  Intake   1110 ml  Output   1150 ml  Net    -40 ml    Exam:   General:  Pt is alert, follows commands appropriately, not in acute distress  Cardiovascular: Regular rhythm, tachycardic, S1/S2, no murmurs, no rubs, no gallops  Respiratory: diminished breath sounds at bases, no wheezing   Abdomen: Soft, non tender, non distended, bowel sounds present, no guarding  Extremities: No edema, pulses DP and PT palpable bilaterally  Neuro: Grossly  nonfocal  Data Reviewed: Basic Metabolic Panel:  Recent Labs Lab 07/05/15 2000 07/06/15 0340 07/07/15 0437 07/08/15 0531  NA 137 139 139 138  K 3.7 4.0 3.5 3.5  CL 103 108 108 107  CO2 24 25 25 25   GLUCOSE 149* 116* 95 107*  BUN 24* 23* 17 16  CREATININE 1.03 0.90 0.79 0.74  CALCIUM 9.1 8.3* 8.4* 8.6*   Liver Function Tests:  Recent Labs Lab 07/05/15 2000  AST 26  ALT 16*  ALKPHOS 71  BILITOT 0.5  PROT 7.1  ALBUMIN 3.9   CBC:  Recent Labs Lab 07/05/15 2000 07/06/15 0340 07/07/15 0437 07/08/15 0531  WBC 13.5* 9.0 10.4 10.7*  NEUTROABS 11.5*  --   --   --   HGB 15.4 13.5 12.9* 13.4  HCT 43.0 38.8* 38.1* 38.6*  MCV 83.2 84.9 84.5 83.9  PLT 217 214 197 173   Cardiac Enzymes:  Recent Labs Lab 07/05/15 2000  CKTOTAL 58   CBG:  Recent Labs Lab 07/05/15 1930  GLUCAP 146*    Scheduled Meds: .  enoxaparin (LOVENOX) injection  40 mg Subcutaneous QHS  . feeding supplement  1 Container Oral BID BM  . FLUoxetine  10 mg Oral Daily  . levETIRAcetam  500 mg Oral BID  . levofloxacin (LEVAQUIN) IV  750 mg Intravenous QHS  . nicotine  14 mg Transdermal Daily  . sodium chloride  3 mL Intravenous Q12H   Continuous Infusions:

## 2015-07-08 NOTE — Care Management (Signed)
This Case Manager received communication from Lanier Clam, RN CM that patient needing hospital follow-up appointment at [facility] and [facility]. Patient plans to use [facility] pharmacy for medications. Hospital follow-up appointment obtained on 07/14/15 at 1115 with Dr. Venetia Night. AVS updated. Lanier Clam, RN CM also updated.

## 2015-07-09 ENCOUNTER — Inpatient Hospital Stay (HOSPITAL_COMMUNITY): Payer: Medicaid Other

## 2015-07-09 DIAGNOSIS — Z9889 Other specified postprocedural states: Secondary | ICD-10-CM

## 2015-07-09 LAB — BASIC METABOLIC PANEL
Anion gap: 6 (ref 5–15)
BUN: 18 mg/dL (ref 6–20)
CHLORIDE: 105 mmol/L (ref 101–111)
CO2: 27 mmol/L (ref 22–32)
CREATININE: 0.68 mg/dL (ref 0.61–1.24)
Calcium: 8.3 mg/dL — ABNORMAL LOW (ref 8.9–10.3)
GFR calc Af Amer: >60 mL/min (ref >60)
GFR calc non Af Amer: >60 mL/min (ref >60)
GLUCOSE: 97 mg/dL (ref 65–99)
POTASSIUM: 3.4 mmol/L — AB (ref 3.5–5.1)
SODIUM: 138 mmol/L (ref 135–145)

## 2015-07-09 LAB — CBC
HCT: 39 % (ref 39.0–52.0)
HEMOGLOBIN: 13.4 g/dL (ref 13.0–17.0)
MCH: 28.8 pg (ref 26.0–34.0)
MCHC: 34.4 g/dL (ref 30.0–36.0)
MCV: 83.9 fL (ref 78.0–100.0)
Platelets: 189 10*3/uL (ref 150–400)
RBC: 4.65 MIL/uL (ref 4.22–5.81)
RDW: 13.4 % (ref 11.5–15.5)
WBC: 9.8 10*3/uL (ref 4.0–10.5)

## 2015-07-09 MED ORDER — LEVOFLOXACIN 750 MG PO TABS: 750.0000 mg | ORAL_TABLET | Freq: Every day | ORAL | Status: DC

## 2015-07-09 MED ORDER — PANTOPRAZOLE SODIUM 40 MG PO TBEC: 40.0000 mg | DELAYED_RELEASE_TABLET | Freq: Every day | ORAL | Status: DC

## 2015-07-09 MED ORDER — IPRATROPIUM-ALBUTEROL 0.5-2.5 (3) MG/3ML IN SOLN: 3.0000 mL | RESPIRATORY_TRACT | Status: DC | PRN

## 2015-07-09 MED ORDER — LEVOFLOXACIN 750 MG PO TABS
750.0000 mg | ORAL_TABLET | Freq: Every day | ORAL | Status: DC
Start: 2015-07-09 — End: 2015-07-09

## 2015-07-09 MED ORDER — NICOTINE 14 MG/24HR TD PT24: 14.0000 mg | MEDICATED_PATCH | Freq: Every day | TRANSDERMAL | Status: DC

## 2015-07-09 MED ORDER — OXYCODONE HCL 5 MG PO TABS: 5.0000 mg | ORAL_TABLET | ORAL | Status: DC | PRN

## 2015-07-09 MED ORDER — DIAZEPAM 5 MG PO TABS: 5.0000 mg | ORAL_TABLET | Freq: Four times a day (QID) | ORAL | Status: DC | PRN

## 2015-07-09 MED ORDER — POTASSIUM CHLORIDE CRYS ER 20 MEQ PO TBCR
40.0000 meq | EXTENDED_RELEASE_TABLET | Freq: Once | ORAL | Status: AC
  Administered 2015-07-09: 40 meq via ORAL
  Filled 2015-07-09: qty 2

## 2015-07-09 NOTE — Discharge Instructions (Signed)
Aspiration Precautions Aspiration is the inhaling of a liquid or object into the lungs. Things that can be inhaled into the lungs include:  Food.  Any type of liquid, such as drinks or saliva.  Stomach contents, such as vomit or stomach acid. When these things go into the lungs, damage can occur. Serious complications can then result, such as:  A lung infection (pneumonia).  A collection of pus in the lungs (lung abscess). CAUSES  A decreased level of awareness (consciousness) due to:  Traumatic brain injury or head injury.  Stroke.  Neurological disease.  Seizures.  Decreased or absent gag reflex (inability to cough).  Medical conditions that affect swallowing.  Conditions that affect the food pipe (esophagus) such as a narrowing of the esophagus (esophageal stricture).  Gastroesophageal reflux (GERD). This is also known as acid reflux.  Any type of surgery where you are put under general anesthesia or have sedation.  Drinking large amounts of alcohol.  Taking medication that causes drowsiness, confusion, or weakness.  Aging.  Dental problems.  Having a feeding tube. SYMPTOMS When aspiration occurs, different signs and symptoms can occur, such as:  Coughing (if a person has a cough or gag reflex) after swallowing food or liquids.  Difficulty breathing. This can include things like:  Breathing rapidly.  Breathing very slowly.  Loud breathing.  Hearing "gurgling" lung sounds when a person breaths.  Coughing up phlegm (sputum) that is:  Yellow, tan, or green in color.  Has pieces of food in it.  Bad smelling.  A change in voice (hoarseness) or a "gurgly" sound to the voice.  A change in skin color. The skin may turn red, or a "bluish" type color because of a lack of oxygen (cyanosis).  Fever.  Eyes watering.  Pain in the chest or back.  Facial grimacing .  A feeling of fullness in the throat or that something is stuck in the  throat. DIAGNOSIS  A chest X-ray may be performed. This takes a picture of your lungs. It can show changes in the lungs if aspiration has occurred.  A bronchoscopy may be performed. This is a surgical procedure in which a thin, flexible tube with a camera at the end is inserted into the nose or mouth. The tube is advanced to the lungs so your health care provider can view the lungs and obtain a culture, tissue sample, or remove an aspirated object.  A swallowing evaluation study may be performed to evaluate:  A person's risk of aspiration.  How difficult it is for a person to swallow.  What types of foods are safe for a person to eat. PREVENTION If you are a caregiver to someone who may aspirate, follow the directions below. If you are caring for someone who can eat and drink through their mouth:  Have them sit in an upright position when eating food or drinking fluids, such as:  Sitting up in a chair.  If sitting in a chair is not possible, position the person in bed so they are upright.  Remind the person to eat slowly and chew well.  Do not distract the person. This is especially important for people with thinking or memory (cognitive) problems.  Check the person's mouth for leftover food after eating.  Keep the person sitting upright for 30 to 45 minutes after eating.  Do not serve food or drink for at least 2 hours before bedtime. If you are caring for someone with a feeding tube and he or she   cannot eat or drink through their mouth:  Keep the person in an upright position as much as possible.  Do not  lay the person flat if they are getting continuous feedings. Turn the feeding pump off if you need to lay the person flat for any reason.  Check feeding tube residuals as directed by your health care provider. If a large amount of tube feedings are pulled back (aspirated) from the feeding tube, call your health care provider right away. General guidelines to prevent  aspiration include:  Feed small amounts of food. Do not force feed.  Use as little water as possible when brushing the person's teeth or cleaning his or her mouth.  Provide oral care before and after meals.  Never put food or fluids in the mouth of a person who is not fully alert.  Crush pills and put them in soft food such as pudding or ice cream. Some pills should not be crushed. Check with your health care provider before crushing any medication. SEEK IMMEDIATE MEDICAL CARE IF:   The person has trouble breathing or starts to breathe rapidly.  The person is breathing very slowly or stops breathing.  The person coughs a lot after eating or drinking.  The person has a chronic cough.  The person coughs up thick, yellow, or tan sputum.  The person has a fever or persistent symptoms for more than 72 hours.  The person has a fever and their symptoms suddenly get worse. Document Released: 11/10/2010 Document Revised: 10/13/2013 Document Reviewed: 01/13/2014 ExitCare Patient Information 2015 [employer]. This information is not intended to replace advice given to you by your health care provider. Make sure you discuss any questions you have with your health care provider.  

## 2015-07-09 NOTE — Progress Notes (Signed)
Contacted [facility] for nebulizer machine for home. Isidoro Donning RN CCM Case Mgmt phone [phone]

## 2015-07-09 NOTE — Discharge Summary (Signed)
Physician Discharge Summary  George Smith PFX:[id number] DOB: 1956-06-21 DOA: 07/05/2015  PCP: PROVIDER NOT IN SYSTEM  Admit date: 07/05/2015 Discharge date: 07/09/2015  Recommendations for Outpatient Follow-up:  1. Pt will need to follow up with PCP in 2-3 weeks post discharge 2. Please obtain BMP to evaluate electrolytes and kidney function 3. Please also check CBC to evaluate Hg and Hct levels 4. Please provide referral to neurologist for evaluation of recurrent seizure and potential need to increase the dose of Keppra 5. Please also note that pt will need repeat CT chest in 1 year, CT chest obtained on this admission concerning for multiple pulmonary nodules    Discharge Diagnoses:  Principal Problem:   Seizure Active Problems:   Right middle lobe pneumonia   Right lower lobe pneumonia   Sepsis due to pneumonia   Abnormal chest x-ray   COPD (chronic obstructive pulmonary disease)   Pleural effusion   Leukocytosis   Noncompliance   CAP (community acquired pneumonia)   Tobacco use disorder   Protein-calorie malnutrition, severe   Sepsis  Discharge Condition: Stable  Diet recommendation: Heart healthy diet discussed in details    Brief narrative:    59 y.o. male with a history of seizures diagnosed 09/2014, history of COPD, Valvular Heart Disease,and Tobacco Use who was brought to the ED due to tonic clonic activity with loss of consciousness lasting 1 hour witnessed by family. EMS was called and while in the ED he was very confused but slowly returned to his baseline. Pt is on Keppra at home but has not been compliant with medications. In ED, CXR was notable for RLL effusion which was apparently present since December 2012 and patient has been hospitalized in [place] at that time. He was advised to follow-up with PCP and to stop smoking. Patient however continues to smoke 1 pack of cigarettes per day. CT chest confirmed moderately large loculated effusion on the right side  with right middle lobe consolidation. Patient was started on Levaquin and was referred for an admission.  Assessment/Plan:    Principal Problem:  Seizure, acute on chronic - Secondary to medical noncompliance - EEG done, no signs of seizure activity per EEG - Continue Keppra as per home medical regimen - no seizures while inpatient  - outpatient neurology follow up recommended   Active Problems:  Sepsis secondary to right middle and lower lobe pneumonia - Patient met criteria for sepsis with WBC 13.5, RR up to 26, HR up to 108, source pneumonia - WBC now within normal limits - Continue Levaquin upon discharge to complete therapy    Acute hypoxic respiratory failure - With initial oxygen saturation 86% on room air - This is most likely secondary to right pleural effusion with right middle/lower lobe pneumonia imposed on acute on chronic COPD - Continue Levaquin to complete therapy upon discharge  - pt is now s/p right side thoracentesis, post op day #3, 880 cc fluid removed and sent for analysis, studies so far with no signs of malignancy    Lobar pneumonia, RML/RLL, CAP, unknown pathogen at this time - Follow up on sputum culture, urine Legionella and strep pneumo, all currently negative to date  - Treatment with Levaquin as noted above   Right moderate to large loculated effusion - Appreciate IR assistance for thoracentesis, s/p right side thoracenteses, removed 880 cc fluid and sent for analysis, preliminary report notable for inflammatory cells present and no signs of malignancy    Acute on chronic COPD (chronic obstructive pulmonary  disease) - No wheezing on exam this morning - Continue broncho-dilators, continue Levaquin as noted above   Medical non compliance - discussed with pt importance of taking medications as prescribed to avoid potential complications such as seizures  - SW consulted, appreciate assistance, pt has medicaid  - [facility]   Numerous subpleural pulmonary nodules bilaterally  - most consistent with pulmonary lymph nodes - follow-up chest CT at 1 year is recommended - I have again discussed this with pt in detail and he has verbalized understanding  - this was also discussed with pt's brother over the phone    Tobacco abuse - Smoking cessation discussed in detail and pt has verbalized understanding    Protein-calorie malnutrition, severe - pt tolerating diet well    Acute nausea and vomiting - resolved and pt tolerating diet well  Code Status: Full.  Family Communication: plan of care discussed with the patient, brother over the phone  Disposition Plan: Home  IV access:  Peripheral IV  Procedures and diagnostic studies:   Ct Chest W Contrast 07/05/2015 Moderately large partially loculated right pleural effusion associated with middle and lower lobe consolidation that certainly includes atelectasis, although pneumonia in the right lower lobe cannot be entirely excluded. Numerous subpleural tiny pulmonary nodules bilaterally most consistent with pulmonary lymph nodes. If the patient is at high risk for bronchogenic carcinoma, follow-up chest CT at 1 year is recommended. If the patient is at low risk, no follow-up is needed. This recommendation follows the consensus statement: Guidelines for Management of Small Pulmonary Nodules Detected on CT Scans: A Statement from the [employer] as published in Radiology 2005; 237:395-400. Numerous mediastinal lymph nodes borderline in size with mildly enlarged right hilar lymph node.These are likely reactive but follow up CT in several months should be considered.   Dg Chest Port 1 View 07/05/2015 Right base opacification likely small effusion with associated atelectasis, although cannot exclude infection in the right base.   Medical Consultants:  IR for thoracentesis   Other Consultants:   None  IAnti-Infectives:   Levaquin 9/13 -->     Discharge Exam: Filed Vitals:   07/09/15 0620  BP: 109/76  Pulse: 109  Temp: 97.9 F (36.6 C)  Resp: 20   Filed Vitals:   07/08/15 1500 07/08/15 2139 07/09/15 0247 07/09/15 0620  BP: 104/72 108/84 91/66 109/76  Pulse: 116 117 112 109  Temp: 98.3 F (36.8 C) 98.2 F (36.8 C) 98.8 F (37.1 C) 97.9 F (36.6 C)  TempSrc: Oral Oral Oral Oral  Resp: 20 20 20 20   Height:      Weight:      SpO2: 95% 94% 95% 96%    General: Pt is alert, follows commands appropriately, not in acute distress Cardiovascular: Regular rhythm, tachycardic, S1/S2 +, no murmurs, no rubs, no gallops Respiratory: Clear to auscultation bilaterally, no wheezing, no crackles, no rhonchi Abdominal: Soft, non tender, non distended, bowel sounds +, no guarding  Discharge Instructions  Discharge Instructions    Diet - low sodium heart healthy    Complete by:  As directed      Increase activity slowly    Complete by:  As directed             Medication List    TAKE these medications        diazepam 5 MG tablet  Commonly known as:  VALIUM  Take 1 tablet (5 mg total) by mouth every  6 (six) hours as needed for anxiety.     FLUoxetine 10 MG capsule  Commonly known as:  PROZAC  Take 10 mg by mouth daily.     furosemide 40 MG tablet  Commonly known as:  LASIX  Take 40 mg by mouth daily.     ipratropium-albuterol 0.5-2.5 (3) MG/3ML Soln  Commonly known as:  DUONEB  Take 3 mLs by nebulization every 4 (four) hours as needed.     levETIRAcetam 500 MG tablet  Commonly known as:  KEPPRA  Take 500 mg by mouth 2 (two) times daily.     levofloxacin 750 MG tablet  Commonly known as:  LEVAQUIN  Take 1 tablet (750 mg total) by mouth at bedtime.     nicotine 14 mg/24hr patch  Commonly known as:  NICODERM CQ - dosed in mg/24 hours  Place 1 patch (14 mg total) onto the skin daily.     oxyCODONE 5 MG immediate release tablet  Commonly known as:  Oxy  IR/ROXICODONE  Take 1 tablet (5 mg total) by mouth every 4 (four) hours as needed for moderate pain.     pantoprazole 40 MG tablet  Commonly known as:  PROTONIX  Take 1 tablet (40 mg total) by mouth daily.              Follow-up Information    Follow up with [facility]     On 07/14/2015.   Why:  Hospital follow-up appointment on 07/14/15 at 11:15 am with Dr. Jarold Song.   Contact information:   [address] [place] [address] [phone]       The results of significant diagnostics from this hospitalization (including imaging, microbiology, ancillary and laboratory) are listed below for reference.     Microbiology: Recent Results (from the past 240 hour(s))  Culture, blood (routine x 2) Call MD if unable to obtain prior to antibiotics being given     Status: None (Preliminary result)   Collection Time: 07/06/15  3:30 AM  Result Value Ref Range Status   Specimen Description BLOOD LEFT AC  Final   Special Requests BOTTLES DRAWN AEROBIC ONLY 5CC  Final   Culture   Final    NO GROWTH 2 DAYS Performed at [facility]    Report Status PENDING  Incomplete  Culture, blood (routine x 2) Call MD if unable to obtain prior to antibiotics being given     Status: None (Preliminary result)   Collection Time: 07/06/15  3:40 AM  Result Value Ref Range Status   Specimen Description BLOOD LEFT HAND  Final   Special Requests BOTTLES DRAWN AEROBIC ONLY 5CC  Final   Culture   Final    NO GROWTH 2 DAYS Performed at [facility]    Report Status PENDING  Incomplete  Culture, body fluid-bottle     Status: None (Preliminary result)   Collection Time: 07/06/15  4:05 PM  Result Value Ref Range Status   Specimen Description FLUID PLEURAL  Final   Special Requests NONE  Final   Culture   Final    NO GROWTH 2 DAYS Performed at [facility]    Report Status PENDING  Incomplete  Gram stain     Status: None   Collection  Time: 07/06/15  4:05 PM  Result Value Ref Range Status   Specimen Description FLUID PLEURAL  Final   Special Requests NONE  Final   Gram Stain   Final  FEW WBC PRESENT, PREDOMINANTLY MONONUCLEAR NO ORGANISMS SEEN Performed at [facility]    Report Status 07/06/2015 FINAL  Final     Labs: Basic Metabolic Panel:  Recent Labs Lab 07/05/15 2000 07/06/15 0340 07/07/15 0437 07/08/15 0531 07/09/15 0522  NA 137 139 139 138 138  K 3.7 4.0 3.5 3.5 3.4*  CL 103 108 108 107 105  CO2 24 25 25 25 27   GLUCOSE 149* 116* 95 107* 97  BUN 24* 23* 17 16 18   CREATININE 1.03 0.90 0.79 0.74 0.68  CALCIUM 9.1 8.3* 8.4* 8.6* 8.3*   Liver Function Tests:  Recent Labs Lab 07/05/15 2000  AST 26  ALT 16*  ALKPHOS 71  BILITOT 0.5  PROT 7.1  ALBUMIN 3.9   CBC:  Recent Labs Lab 07/05/15 2000 07/06/15 0340 07/07/15 0437 07/08/15 0531 07/09/15 0522  WBC 13.5* 9.0 10.4 10.7* 9.8  NEUTROABS 11.5*  --   --   --   --   HGB 15.4 13.5 12.9* 13.4 13.4  HCT 43.0 38.8* 38.1* 38.6* 39.0  MCV 83.2 84.9 84.5 83.9 83.9  PLT 217 214 197 173 189   Cardiac Enzymes:  Recent Labs Lab 07/05/15 2000  CKTOTAL 58   CBG:  Recent Labs Lab 07/05/15 1930  GLUCAP 146*   SIGNED: Time coordinating discharge: 30 minutes  Faye Ramsay, MD  [facility] 07/09/2015, 10:31 AM Pager [phone]  If 7PM-7AM, please contact night-coverage [email or web address] Password [email or web address]

## 2015-07-11 LAB — CULTURE, BODY FLUID W GRAM STAIN -BOTTLE: Culture: NO GROWTH

## 2015-07-11 LAB — CULTURE, BLOOD (ROUTINE X 2)
CULTURE: NO GROWTH
CULTURE: NO GROWTH

## 2015-07-14 ENCOUNTER — Inpatient Hospital Stay: Payer: Medicaid Other | Admitting: Family Medicine

## 2015-07-19 ENCOUNTER — Encounter: Payer: Self-pay | Admitting: Family Medicine

## 2015-07-19 ENCOUNTER — Ambulatory Visit: Payer: Medicaid Other | Attending: Family Medicine | Admitting: Family Medicine

## 2015-07-19 VITALS — BP 117/81 | HR 85 | Temp 97.5°F | Ht 67.0 in | Wt 143.0 lb

## 2015-07-19 DIAGNOSIS — J90 Pleural effusion, not elsewhere classified: Secondary | ICD-10-CM

## 2015-07-19 DIAGNOSIS — Z79899 Other long term (current) drug therapy: Secondary | ICD-10-CM | POA: Diagnosis not present

## 2015-07-19 DIAGNOSIS — J189 Pneumonia, unspecified organism: Secondary | ICD-10-CM

## 2015-07-19 DIAGNOSIS — M25511 Pain in right shoulder: Secondary | ICD-10-CM | POA: Diagnosis not present

## 2015-07-19 DIAGNOSIS — J181 Lobar pneumonia, unspecified organism: Secondary | ICD-10-CM

## 2015-07-19 DIAGNOSIS — R634 Abnormal weight loss: Secondary | ICD-10-CM

## 2015-07-19 DIAGNOSIS — M25519 Pain in unspecified shoulder: Secondary | ICD-10-CM | POA: Insufficient documentation

## 2015-07-19 DIAGNOSIS — Z6822 Body mass index (BMI) 22.0-22.9, adult: Secondary | ICD-10-CM | POA: Insufficient documentation

## 2015-07-19 DIAGNOSIS — M25512 Pain in left shoulder: Secondary | ICD-10-CM | POA: Diagnosis not present

## 2015-07-19 DIAGNOSIS — J441 Chronic obstructive pulmonary disease with (acute) exacerbation: Secondary | ICD-10-CM

## 2015-07-19 DIAGNOSIS — R569 Unspecified convulsions: Secondary | ICD-10-CM | POA: Diagnosis present

## 2015-07-19 DIAGNOSIS — R251 Tremor, unspecified: Secondary | ICD-10-CM | POA: Diagnosis not present

## 2015-07-19 DIAGNOSIS — J449 Chronic obstructive pulmonary disease, unspecified: Secondary | ICD-10-CM | POA: Insufficient documentation

## 2015-07-19 LAB — BASIC METABOLIC PANEL
BUN: 22 mg/dL (ref 7–25)
CHLORIDE: 101 mmol/L (ref 98–110)
CO2: 27 mmol/L (ref 20–31)
Calcium: 9.3 mg/dL (ref 8.6–10.3)
Creat: 0.73 mg/dL (ref 0.70–1.25)
Glucose, Bld: 81 mg/dL (ref 65–99)
POTASSIUM: 4.4 mmol/L (ref 3.5–5.3)
SODIUM: 139 mmol/L (ref 135–146)

## 2015-07-19 LAB — TSH: TSH: 4.002 u[IU]/mL (ref 0.350–4.500)

## 2015-07-19 MED ORDER — IBUPROFEN 600 MG PO TABS: 600.0000 mg | ORAL_TABLET | Freq: Three times a day (TID) | ORAL | Status: AC | PRN

## 2015-07-19 MED ORDER — LEVETIRACETAM 500 MG PO TABS: 500.0000 mg | ORAL_TABLET | Freq: Two times a day (BID) | ORAL | Status: DC

## 2015-07-19 MED ORDER — IPRATROPIUM-ALBUTEROL 0.5-2.5 (3) MG/3ML IN SOLN: 3.0000 mL | RESPIRATORY_TRACT | Status: AC | PRN

## 2015-07-19 MED ORDER — FLUOXETINE HCL 10 MG PO CAPS: 10.0000 mg | ORAL_CAPSULE | Freq: Every day | ORAL | Status: AC

## 2015-07-19 MED ORDER — PANTOPRAZOLE SODIUM 40 MG PO TBEC: 40.0000 mg | DELAYED_RELEASE_TABLET | Freq: Every day | ORAL | Status: DC

## 2015-07-19 NOTE — Patient Instructions (Signed)

## 2015-07-19 NOTE — Progress Notes (Signed)
Subjective:  Patient ID: George Smith, male    DOB: October 09, 1956  Age: 59 y.o. MRN: [id number]  CC: Hospitalization Follow-up   HPI George Smith he is a 59 year old male who relocated from [place] 4 months ago with a history of seizures, COPD, tobacco use, ? Valvular heart disease (as per patient's brother) who comes into the clinic for a follow-up from hospitalization at [facility] from 07/05/15-07/09/15.  He was brought into the ED by EMS due to a generalized tonic-clonic seizure with loss of consciousness and the report was that he had not been compliant with his Keppra. A chest x-ray was notable for right lower lobe effusion and CT chest confirmed moderately large loculated effusion on the right side with right middle lobe consolidation and presence of multiple pulmonary lymph nodules consistent with pulmonary lymph nodes, follow-up chest CT at 1 year  Recommended.   Patient was started on Levaquin and was referred for an admission. He was continued on Keppra and EEG done revealed no signs of seizure activity. His initial oxygen saturation was 86% on room air for which he had right-sided thoracocentesis by IR with removal of 880 mL of fluid which was negative for malignancy. His oxygen saturation improved and so did his symptoms and he was subsequently discharged on Levaquin with recommendation to follow up with neurology for his seizures.  Interval history: He has had no seizures since discharge and endorses compliance with his Keppra. He complains of shoulder pains which range from 4/10 to a 10/10 and is being relieved by the use of a 'salve' He was given oxycodone at the hospital which she has run out of and is requesting something that is not addicting. His brother comes in with him and gives a major part of the history; he complains of the patient losing weight and is also requesting the patient be referred to cardiology for ? Heart problem which they were informed of while the patient was  in [place]. He does not have records with him and I have advised him to bring this in at his next office visit.    Outpatient Prescriptions Prior to Visit  Medication Sig Dispense Refill  . diazepam (VALIUM) 5 MG tablet Take 1 tablet (5 mg total) by mouth every 6 (six) hours as needed for anxiety. 30 tablet 0  . furosemide (LASIX) 40 MG tablet Take 40 mg by mouth daily.    Marland Kitchen levofloxacin (LEVAQUIN) 750 MG tablet Take 1 tablet (750 mg total) by mouth at bedtime. 3 tablet 0  . nicotine (NICODERM CQ - DOSED IN MG/24 HOURS) 14 mg/24hr patch Place 1 patch (14 mg total) onto the skin daily. 28 patch 0  . FLUoxetine (PROZAC) 10 MG capsule Take 10 mg by mouth daily.    Marland Kitchen ipratropium-albuterol (DUONEB) 0.5-2.5 (3) MG/3ML SOLN Take 3 mLs by nebulization every 4 (four) hours as needed. 360 mL 3  . levETIRAcetam (KEPPRA) 500 MG tablet Take 500 mg by mouth 2 (two) times daily.    Marland Kitchen oxyCODONE (OXY IR/ROXICODONE) 5 MG immediate release tablet Take 1 tablet (5 mg total) by mouth every 4 (four) hours as needed for moderate pain. 30 tablet 0  . pantoprazole (PROTONIX) 40 MG tablet Take 1 tablet (40 mg total) by mouth daily. 30 tablet 1   No facility-administered medications prior to visit.    ROS Review of Systems  Constitutional: Negative for activity change and appetite change.  HENT: Negative for sinus pressure and sore throat.   Eyes: Negative  for visual disturbance.  Respiratory: Negative for cough, chest tightness and shortness of breath.   Cardiovascular: Negative for chest pain and leg swelling.  Gastrointestinal: Negative for abdominal pain, diarrhea, constipation and abdominal distention.  Endocrine: Negative.   Genitourinary: Negative for dysuria.  Musculoskeletal:       See history of present illness  Skin: Negative for rash.  Allergic/Immunologic: Negative.   Neurological: Negative for weakness, light-headedness and numbness.  Psychiatric/Behavioral: Negative for suicidal ideas  and dysphoric mood.    Objective:  BP 117/81 mmHg  Pulse 85  Temp(Src) 97.5 F (36.4 C)  Ht 5\' 7"  (1.702 m)  Wt 143 lb (64.864 kg)  BMI 22.39 kg/m2  SpO2 97%  BP/Weight 07/19/2015 07/09/2015 07/05/2015  Systolic BP 117 95 -  Diastolic BP 81 71 -  Wt. (Lbs) 143 - 147.93  BMI 22.39 - 23.16    Lab Results  Component Value Date   WBC 9.8 07/09/2015   HGB 13.4 07/09/2015   HCT 39.0 07/09/2015   PLT 189 07/09/2015   GLUCOSE 97 07/09/2015   ALT 16* 07/05/2015   AST 26 07/05/2015   NA 138 07/09/2015   K 3.4* 07/09/2015   CL 105 07/09/2015   CREATININE 0.68 07/09/2015   BUN 18 07/09/2015   CO2 27 07/09/2015   Dg Chest 1 View  07/06/2015   CLINICAL DATA:  Right thoracentesis.  EXAM: CHEST  1 VIEW  COMPARISON:  One-view chest x-ray 07/05/2015.  FINDINGS: Heart is mildly enlarged. Mild edema is again noted. A right pleural effusion is decreased. There is no significant pneumothorax.  IMPRESSION: Decrease in right pleural effusion without significant pneumothorax.   Electronically Signed   By: Marin Roberts M.D.   On: 07/06/2015 16:27   Dg Chest 2 View  07/09/2015   CLINICAL DATA:  Dyspnea, cough, smoking, COPD, lung disease  EXAM: CHEST  2 VIEW  COMPARISON:  07/06/2015  FINDINGS: Small right pleural effusion with associated airspace disease likely reflecting atelectasis. Mild bilateral interstitial prominence. There is no pleural effusion or pneumothorax. The heart and mediastinal contours are unremarkable.  The osseous structures are unremarkable.  IMPRESSION: Small right pleural effusion with associated airspace disease which likely reflects atelectasis. No significant interval change.   Electronically Signed   By: Elige Ko   On: 07/09/2015 09:18   Ct Chest W Contrast  07/05/2015   CLINICAL DATA:  Seizure activity earlier today lasting 30 minutes, patient became cyanotic and hypoxic to 86%, patient gives history of fluid around lungs and heart  EXAM: CT CHEST WITH CONTRAST   TECHNIQUE: Multidetector CT imaging of the chest was performed during intravenous contrast administration.  CONTRAST:  75mL OMNIPAQUE IOHEXOL 300 MG/ML  SOLN  COMPARISON:  07/05/2015 chest radiograph  FINDINGS: Mediastinum/Nodes: No significant thyroid abnormalities. Numerous mediastinal lymph nodes. The largest superior mediastinal lymph node is 8 mm in short axis. The largest precarinal lymph node is 9 mm in short axis. Smaller precarinal and aortopulmonary window lymph nodes are present. There is a 10 mm right hilar lymph node.  There is trace pericardial effusion. There is heavy calcification of the mitral valve. There is mild coronary artery calcification. There is mild thoracic aorta calcification.  Lungs/Pleura: There is a moderately large and partially loculated right pleural effusion. Subpleural consolidation anterior right middle lobe and right lung base, moderately extensive, consistent with atelectasis. Multiple right subpleural tiny nodules measuring about 2 mm, likely representing pulmonary lymph nodes. Multiple similar nodules on the left in the upper and  lower lobe. The left lung is otherwise clear.  Upper abdomen: 6 mm cyst in the dome of the liver. Limited images of the upper abdomen otherwise unremarkable.  Musculoskeletal: No acute findings  IMPRESSION: Moderately large partially loculated right pleural effusion associated with middle and lower lobe consolidation that certainly includes atelectasis, although pneumonia in the right lower lobe cannot be entirely excluded.  Numerous subpleural tiny pulmonary nodules bilaterally most consistent with pulmonary lymph nodes. If the patient is at high risk for bronchogenic carcinoma, follow-up chest CT at 1 year is recommended. If the patient is at low risk, no follow-up is needed. This recommendation follows the consensus statement: Guidelines for Management of Small Pulmonary Nodules Detected on CT Scans: A Statement from the Fleischner Society as  published in Radiology 2005; 237:395-400.  Numerous mediastinal lymph nodes borderline in size with mildly enlarged right hilar lymph node.These are likely reactive but follow up CT in several months should be considered.   Electronically Signed   By: Esperanza Heir M.D.   On: 07/05/2015 22:00   Dg Chest Port 1 View  07/05/2015   CLINICAL DATA:  Post seizure.  EXAM: PORTABLE CHEST - 1 VIEW  COMPARISON:  None.  FINDINGS: Lungs are adequately inflated with right base opacification likely a small effusion with associated atelectasis, although cannot exclude infection in the right base. Mild cardiomegaly. Degenerative change of the spine.  IMPRESSION: Right base opacification likely small effusion with associated atelectasis, although cannot exclude infection in the right base.   Electronically Signed   By: Elberta Fortis M.D.   On: 07/05/2015 20:06   US Thoracentesis Asp Pleural Space W/img Guide  07/06/2015   INDICATION: COPD, pneumonia, right pleural effusion, dyspnea. Request is made for diagnostic and therapeutic right thoracentesis.  EXAM: ULTRASOUND GUIDED DIAGNOSTIC AND THERAPEUTIC RIGHT THORACENTESIS  COMPARISON:  None.  MEDICATIONS: None  COMPLICATIONS: None immediate  TECHNIQUE: Informed written consent was obtained from the patient after a discussion of the risks, benefits and alternatives to treatment. A timeout was performed prior to the initiation of the procedure.  Initial ultrasound scanning demonstrates a moderate to large right pleural effusion. The lower chest was prepped and draped in the usual sterile fashion. 1% lidocaine was used for local anesthesia.  An ultrasound image was saved for documentation purposes. A 6 Fr Safe-T-Centesis catheter was introduced. The thoracentesis was performed. The catheter was removed and a dressing was applied. The patient tolerated the procedure well without immediate post procedural complication. The patient was escorted to have an upright chest radiograph.   FINDINGS: A total of approximately 880 cc of yellow fluid was removed. Requested samples were sent to the laboratory.  IMPRESSION: Successful ultrasound-guided diagnostic and therapeutic right sided thoracentesis yielding 880 cc of pleural fluid.  Read by: Jeananne Rama, PA-C   Electronically Signed   By: Malachy Moan M.D.   On: 07/06/2015 16:23    Physical Exam  Constitutional: He is oriented to person, place, and time. He appears well-developed and well-nourished.  HENT:  Head: Normocephalic and atraumatic.  Right Ear: External ear normal.  Left Ear: External ear normal.  Eyes: Conjunctivae and EOM are normal. Pupils are equal, round, and reactive to light.  Neck: Normal range of motion. Neck supple. No tracheal deviation present.  Cardiovascular: Normal rate, regular rhythm and normal heart sounds.   No murmur heard. Pulmonary/Chest: Effort normal and breath sounds normal. No respiratory distress. He has no wheezes. He exhibits no tenderness.  Abdominal: Soft. Bowel sounds are  normal. He exhibits no mass. There is no tenderness.  Musculoskeletal: Normal range of motion. He exhibits tenderness (tenderness on range of motion of left shoulder). He exhibits no edema.  Neurological: He is alert and oriented to person, place, and time.  Tremors in the left upper extremity on extension of the upper extremities  Skin: Skin is warm and dry.  Psychiatric: He has a normal mood and affect.     Assessment & Plan:   1. Seizure Last seizure was a month ago Refill Keppra - Basic Metabolic Panel - Ambulatory referral to Neurology  2. Chronic obstructive pulmonary disease with acute exacerbation No acute exacerbation. Continue duo nebs as needed  3. Pleural effusion We will need to order a chest x-ray in 3 weeks to evaluate for resolution of right pleural effusion  4. Right middle lobe pneumonia Currently on Levaquin  5. Loss of weight In the setting of tremors and weight loss I will  need to exclude thyroid disorder. If this is negative, he will need a malignancy workup including colonoscopy. - TSH  6. Bilateral shoulder pain I have discontinued oxycodone and placed him on ibuprofen. If symptoms persist I will place him on tramadol.   Meds ordered this encounter  Medications  . FLUoxetine (PROZAC) 10 MG capsule    Sig: Take 1 capsule (10 mg total) by mouth daily.    Dispense:  30 capsule    Refill:  1  . ipratropium-albuterol (DUONEB) 0.5-2.5 (3) MG/3ML SOLN    Sig: Take 3 mLs by nebulization every 4 (four) hours as needed.    Dispense:  360 mL    Refill:  1  . pantoprazole (PROTONIX) 40 MG tablet    Sig: Take 1 tablet (40 mg total) by mouth daily.    Dispense:  30 tablet    Refill:  1  . levETIRAcetam (KEPPRA) 500 MG tablet    Sig: Take 1 tablet (500 mg total) by mouth 2 (two) times daily.    Dispense:  60 tablet    Refill:  1  . ibuprofen (ADVIL,MOTRIN) 600 MG tablet    Sig: Take 1 tablet (600 mg total) by mouth every 8 (eight) hours as needed.    Dispense:  60 tablet    Refill:  1    Follow-up: Return in about 3 weeks (around 08/09/2015) for follow up of pleural effusion with Dr Venetia Night.   George Shaggy MD

## 2015-07-19 NOTE — Progress Notes (Signed)
Hospital follow up for seizure Patient has no PCP and is needing referrals to cards, neuro Patient complains of bilateral shoulder pain 4/10 that started a month ago Patient states he is losing weight and has no appetite He reports quitting smoking a month and a half ago, he denies alcohol and reports marijuana use

## 2015-07-22 ENCOUNTER — Telehealth: Payer: Self-pay | Admitting: *Deleted

## 2015-07-22 NOTE — Telephone Encounter (Signed)
Spoke to patient and verified name and date of birth. Told patient labs were normal.  Patient verbalized understanding.

## 2015-07-22 NOTE — Telephone Encounter (Signed)
-----   Message from Jaclyn Shaggy, MD sent at 07/20/2015  8:30 AM EDT ----- Please inform the patient that labs are normal. Thank you.

## 2015-08-03 ENCOUNTER — Encounter: Payer: Self-pay | Admitting: Diagnostic Neuroimaging

## 2015-08-03 ENCOUNTER — Ambulatory Visit (INDEPENDENT_AMBULATORY_CARE_PROVIDER_SITE_OTHER): Payer: Medicaid Other | Admitting: Diagnostic Neuroimaging

## 2015-08-03 VITALS — BP 141/95 | HR 108 | Ht 67.0 in | Wt 162.0 lb

## 2015-08-03 DIAGNOSIS — G40909 Epilepsy, unspecified, not intractable, without status epilepticus: Secondary | ICD-10-CM | POA: Diagnosis not present

## 2015-08-03 MED ORDER — LEVETIRACETAM 500 MG PO TABS: 500.0000 mg | ORAL_TABLET | Freq: Two times a day (BID) | ORAL | Status: AC

## 2015-08-03 NOTE — Progress Notes (Signed)
[facility]  PATIENT: George Smith DOB: Aug 15, 1956  REFERRING CLINICIAN: Amao HISTORY FROM: patient, brother, daughter  REASON FOR VISIT: new consult     HISTORICAL  CHIEF COMPLAINT:  Chief Complaint  Patient presents with  . Seizures    rm 7, New Patient, daughter- Zella Ball, brotherLollie Sails    HISTORY OF PRESENT ILLNESS:   59 year old left-handed male here for evaluation of seizure disorder.   2014 patient had new onset episodes of seizures at work. Patient had 2 seizures and therefore was told he could no longer work. He is started on antiseizure medication and did well until August 2016. Patient had seizure and his brother's house.   He had a second seizure on 07/05/2015 that was much more severe. Patient came to the hospital and was admitted for evaluation. Patient also was diagnosed with pleural effusion, heart problem, and is now establishing with local doctors to have further workup.  Patient is taking his medication without difficulty. He is on levetiracetam 500 mg twice day. His seizure in August may have been provoked by variable compliance with medication. His seizure in September was likely provoked by running out of medication.  No family history of seizure.   REVIEW OF SYSTEMS: Full 14 system review of systems performed and notable only for fevers chills weight loss fatigue itching allergies aching muscles memory loss confusion headache weakness dizziness seizure tremor sleepiness easy bruising depression not enough sleep decreased energy disinterest in activities.   ALLERGIES: No Known Allergies  HOME MEDICATIONS: Outpatient Prescriptions Prior to Visit  Medication Sig Dispense Refill  . diazepam (VALIUM) 5 MG tablet Take 1 tablet (5 mg total) by mouth every 6 (six) hours as needed for anxiety. 30 tablet 0  . FLUoxetine (PROZAC) 10 MG capsule Take 1 capsule (10 mg total) by mouth daily. 30 capsule 1  . furosemide (LASIX) 40 MG tablet Take 40  mg by mouth daily.    Marland Kitchen ibuprofen (ADVIL,MOTRIN) 600 MG tablet Take 1 tablet (600 mg total) by mouth every 8 (eight) hours as needed. 60 tablet 1  . ipratropium-albuterol (DUONEB) 0.5-2.5 (3) MG/3ML SOLN Take 3 mLs by nebulization every 4 (four) hours as needed. 360 mL 1  . levETIRAcetam (KEPPRA) 500 MG tablet Take 1 tablet (500 mg total) by mouth 2 (two) times daily. 60 tablet 1  . levofloxacin (LEVAQUIN) 750 MG tablet Take 1 tablet (750 mg total) by mouth at bedtime. 3 tablet 0  . nicotine (NICODERM CQ - DOSED IN MG/24 HOURS) 14 mg/24hr patch Place 1 patch (14 mg total) onto the skin daily. 28 patch 0  . pantoprazole (PROTONIX) 40 MG tablet Take 1 tablet (40 mg total) by mouth daily. 30 tablet 1   No facility-administered medications prior to visit.    PAST MEDICAL HISTORY: Past Medical History  Diagnosis Date  . COPD (chronic obstructive pulmonary disease) (HCC)   . 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 Lung disease     spots on lungs that have not been checked on  . Seizures (HCC) 05/2015  . Heart disease   . Headache     migraines  . Depression   . Anxiety     PAST SURGICAL HISTORY: Past Surgical History  Procedure Laterality Date  . No past surgeries      FAMILY HISTORY: History reviewed. No pertinent family history.  SOCIAL HISTORY:  Social History   Social History  . Marital Status: Married    Spouse  Name: Selena Batten  . Number of Children: 3  . Years of Education: 9   Occupational History  .      disabled   Social History Main Topics  . Smoking status: Former Smoker -- 1.00 packs/day for 58 years    Types: Cigarettes    Quit date: 05/18/2015  . Smokeless tobacco: Never Used  . Alcohol Use: No  . Drug Use: Yes    Special: Marijuana     Comment: last use 3 months ago  . Sexual Activity: Not on file   Other Topics Concern  . Not on file   Social History Narrative   Lives with wife, family   Caffeine use- coffee, 1 cup daily      PHYSICAL EXAM  GENERAL EXAM/CONSTITUTIONAL: Vitals:  Filed Vitals:   08/03/15 1107  BP: 141/95  Pulse: 108  Height:  (1.702 m)  Weight: 162 lb (73.483 kg)     Body mass index is 25.37 kg/(m^2).  Visual Acuity Screening   Right eye Left eye Both eyes  Without correction: 20/30 20/30   With correction:        Patient is in no distress; well developed, nourished and groomed; neck is supple  DISHEVELED; SLOW RESPONSES  CARDIOVASCULAR:  Examination of carotid arteries is normal; no carotid bruits  Regular rate and rhythm, no murmurs  Examination of peripheral vascular system by observation and palpation is normal  EYES:  Ophthalmoscopic exam of optic discs and posterior segments is normal; no papilledema or hemorrhages  MUSCULOSKELETAL:  Gait, strength, tone, movements noted in Neurologic exam below  NEUROLOGIC: MENTAL STATUS:  No flowsheet data found.  awake, alert, oriented to person, place and time  recent and remote memory intact  normal attention and concentration  language fluent, comprehension intact, naming intact,   fund of knowledge appropriate  CRANIAL NERVE:   2nd - no papilledema on fundoscopic exam  2nd, 3rd, 4th, 6th - pupils equal and reactive to light, visual fields full to confrontation, extraocular muscles intact, no nystagmus  5th - facial sensation symmetric  7th - facial strength symmetric  8th - hearing intact  9th - palate elevates symmetrically, uvula midline  11th - shoulder shrug symmetric  12th - tongue protrusion midline  MOTOR:   normal bulk and tone, full strength in the BUE, BLE  SENSORY:   normal and symmetric to light touch, temperature, vibration   COORDINATION:   finger-nose-finger, fine finger movements --> MILD TREMOR IN LUE ON FTN  REFLEXES:   deep tendon reflexes present and symmetric  GAIT/STATION:   narrow based gait; romberg is negative    DIAGNOSTIC DATA (LABS, IMAGING,  TESTING) - I reviewed patient records, labs, notes, testing and imaging myself where available.  Lab Results  Component Value Date   WBC 9.8 07/09/2015   HGB 13.4 07/09/2015   HCT 39.0 07/09/2015   MCV 83.9 07/09/2015   PLT 189 07/09/2015      Component Value Date/Time   NA 139 07/19/2015 1226   K 4.4 07/19/2015 1226   CL 101 07/19/2015 1226   CO2 27 07/19/2015 1226   GLUCOSE 81 07/19/2015 1226   BUN 22 07/19/2015 1226   CREATININE 0.73 07/19/2015 1226   CREATININE 0.68 07/09/2015 0522   CALCIUM 9.3 07/19/2015 1226   PROT 7.1 07/05/2015 2000   ALBUMIN 3.9 07/05/2015 2000   AST 26 07/05/2015 2000   ALT 16* 07/05/2015 2000   ALKPHOS 71 07/05/2015 2000   BILITOT 0.5 07/05/2015 2000  GFRNONAA >60 07/09/2015 0522   GFRAA >60 07/09/2015 0522   No results found for: CHOL, HDL, LDLCALC, LDLDIRECT, TRIG, CHOLHDL No results found for: [id number] No results found for: VITAMINB12 Lab Results  Component Value Date   TSH 4.002 07/19/2015    07/06/15 EEG - normal awake and asleep EEG.  07/09/15 CXR [I reviewed images myself and agree with interpretation. -VRP]  - Small right pleural effusion with associated airspace disease which likely reflects atelectasis. No significant interval change.  07/05/15 CT chest  - Moderately large partially loculated right pleural effusion associated with middle and lower lobe consolidation that certainly includes atelectasis, although pneumonia in the right lower lobe cannot be entirely excluded. - Numerous subpleural tiny pulmonary nodules bilaterally most consistent with pulmonary lymph nodes. If the patient is at high risk for bronchogenic carcinoma, follow-up chest CT at 1 year is recommended. If the patient is at low risk, no follow-up is needed. This recommendation follows the consensus statement: Guidelines for Management of Small Pulmonary Nodules Detected on CT Scans: A Statement from the Fleischner Society as published in Radiology 2005; 237:395-400.   - Numerous mediastinal lymph nodes borderline in size with mildly enlarged right hilar lymph node.These are likely reactive but follow up CT in several months should be considered.       ASSESSMENT AND PLAN  59 y.o. year old male here with new onset seizure disorder since 2014. Last seizure on 07/05/15.   Dx: Seizure disorder (HCC) - Plan: MR Brain W Wo Contrast    PLAN: - continue LEV 500mg  BID - check MRI brain - PCP follow up of pleural effusion, weight loss, poor appetite - no driving until seizure free x 6 months  Orders Placed This Encounter  Procedures  . MR Brain W Wo Contrast   Return in about 3 months (around 11/03/2015).  I reviewed images, labs, notes, records myself. I summarized findings and reviewed with patient, for this high risk condition (seizure disorder) requiring high complexity decision making.    Suanne MarkerVIKRAM R. Frances Ambrosino, MD 08/03/2015, 11:48 AM Certified in Neurology, Neurophysiology and Neuroimaging  [facility] [address], [address] [place], [place] [address] [phone]

## 2015-08-03 NOTE — Patient Instructions (Signed)
Thank you for coming to see [place] at [facility]. I hope we have been able to provide you high quality care today.  You may receive a patient satisfaction survey over the next few weeks. We would appreciate your feedback and comments so that we may continue to improve ourselves and the health of our patients.  - continue levetiracetam 552m twice a day - no driving until seizure free for 6 months   ~~~~~~~~~~~~~~~~~~~~~~~~~~~~~~~~~~~~~~~~~~~~~~~~~~~~~~~~~~~~~~~~~  DR. Cynthya Yam'S GUIDE TO HAPPY AND HEALTHY LIVING These are some of my general health and wellness recommendations. Some of them may apply to you better than others. Please use common sense as you try these suggestions and feel free to ask me any questions.   ACTIVITY/FITNESS Mental, social, emotional and physical stimulation are very important for brain and body health. Try learning a new activity (arts, music, language, sports, games).  Keep moving your body to the best of your abilities. You can do this at home, inside or outside, the park, community center, gym or anywhere you like. Consider a physical therapist or personal trainer to get started. Consider the app Sworkit. Fitness trackers such as smart-watches, smart-phones or Fitbits can help as well.   NUTRITION Eat more plants: colorful vegetables, nuts, seeds and berries.  Eat less sugar, salt, preservatives and processed foods.  Avoid toxins such as cigarettes and alcohol.  Drink water when you are thirsty. Warm water with a slice of lemon is an excellent morning drink to start the day.  Consider these websites for more information The Nutrition Source (hhttps://www.henry-hernandez.biz/ [employer] Nutrition (wWindowBlog.ch   RELAXATION Consider practicing mindfulness meditation or other relaxation techniques such as deep breathing, prayer, yoga, tai chi, massage. See website mindful.org or the apps  Headspace or Calm to help get started.   SLEEP Try to get at least 7-8+ hours sleep per day. Regular exercise and reduced caffeine will help you sleep better. Practice good sleep hygeine techniques. See website sleep.org for more information.   PLANNING Prepare estate planning, living will, healthcare POA documents. Sometimes this is best planned with the help of an attorney. Theconversationproject.org and agingwithdignity.org are excellent resources.

## 2015-08-10 ENCOUNTER — Telehealth: Payer: Self-pay | Admitting: Diagnostic Neuroimaging

## 2015-08-10 ENCOUNTER — Ambulatory Visit
Admission: RE | Admit: 2015-08-10 | Discharge: 2015-08-10 | Disposition: A | Discharge status: Home / Self Care | Payer: Medicaid Other | Source: Ambulatory Visit | Attending: Diagnostic Neuroimaging | Admitting: Diagnostic Neuroimaging

## 2015-08-10 ENCOUNTER — Other Ambulatory Visit: Payer: Self-pay | Admitting: Diagnostic Neuroimaging

## 2015-08-10 DIAGNOSIS — G40909 Epilepsy, unspecified, not intractable, without status epilepticus: Secondary | ICD-10-CM

## 2015-08-10 DIAGNOSIS — Z139 Encounter for screening, unspecified: Secondary | ICD-10-CM

## 2015-08-10 NOTE — Telephone Encounter (Signed)
[facility] called to let us know patient was unable to complete MRI today due to breathing difficulty.  He will follow up with his primary care for his breathing condition and call them back when he feels better to reschedule.

## 2015-08-15 ENCOUNTER — Ambulatory Visit: Payer: Medicaid Other | Admitting: Family Medicine

## 2015-08-26 DIAGNOSIS — Z0271 Encounter for disability determination: Secondary | ICD-10-CM

## 2015-08-29 ENCOUNTER — Emergency Department: Payer: Medicaid Other

## 2015-08-29 ENCOUNTER — Inpatient Hospital Stay
Admission: EM | Admit: 2015-08-29 | Discharge: 2015-09-04 | DRG: 871 | Disposition: A | Discharge status: Home / Self Care | Payer: Medicaid Other | Attending: Internal Medicine | Admitting: Internal Medicine

## 2015-08-29 ENCOUNTER — Inpatient Hospital Stay: Payer: Medicaid Other

## 2015-08-29 DIAGNOSIS — Z9114 Patient's other noncompliance with medication regimen: Secondary | ICD-10-CM | POA: Diagnosis not present

## 2015-08-29 DIAGNOSIS — F419 Anxiety disorder, unspecified: Secondary | ICD-10-CM | POA: Diagnosis present

## 2015-08-29 DIAGNOSIS — J45909 Unspecified asthma, uncomplicated: Secondary | ICD-10-CM | POA: Diagnosis present

## 2015-08-29 DIAGNOSIS — A691 Other Vincent's infections: Secondary | ICD-10-CM

## 2015-08-29 DIAGNOSIS — J90 Pleural effusion, not elsewhere classified: Secondary | ICD-10-CM | POA: Diagnosis present

## 2015-08-29 DIAGNOSIS — J948 Other specified pleural conditions: Secondary | ICD-10-CM | POA: Diagnosis present

## 2015-08-29 DIAGNOSIS — S0990XA Unspecified injury of head, initial encounter: Secondary | ICD-10-CM | POA: Diagnosis present

## 2015-08-29 DIAGNOSIS — J189 Pneumonia, unspecified organism: Secondary | ICD-10-CM | POA: Diagnosis present

## 2015-08-29 DIAGNOSIS — R0600 Dyspnea, unspecified: Secondary | ICD-10-CM

## 2015-08-29 DIAGNOSIS — R4182 Altered mental status, unspecified: Secondary | ICD-10-CM | POA: Diagnosis present

## 2015-08-29 DIAGNOSIS — Z6829 Body mass index (BMI) 29.0-29.9, adult: Secondary | ICD-10-CM

## 2015-08-29 DIAGNOSIS — J9620 Acute and chronic respiratory failure, unspecified whether with hypoxia or hypercapnia: Secondary | ICD-10-CM | POA: Diagnosis present

## 2015-08-29 DIAGNOSIS — J449 Chronic obstructive pulmonary disease, unspecified: Secondary | ICD-10-CM | POA: Diagnosis present

## 2015-08-29 DIAGNOSIS — R0602 Shortness of breath: Secondary | ICD-10-CM | POA: Diagnosis present

## 2015-08-29 DIAGNOSIS — R0609 Other forms of dyspnea: Secondary | ICD-10-CM | POA: Diagnosis not present

## 2015-08-29 DIAGNOSIS — Z833 Family history of diabetes mellitus: Secondary | ICD-10-CM

## 2015-08-29 DIAGNOSIS — E43 Unspecified severe protein-calorie malnutrition: Secondary | ICD-10-CM | POA: Diagnosis present

## 2015-08-29 DIAGNOSIS — A419 Sepsis, unspecified organism: Secondary | ICD-10-CM | POA: Diagnosis present

## 2015-08-29 DIAGNOSIS — I272 Other secondary pulmonary hypertension: Secondary | ICD-10-CM | POA: Diagnosis present

## 2015-08-29 DIAGNOSIS — F418 Other specified anxiety disorders: Secondary | ICD-10-CM | POA: Diagnosis present

## 2015-08-29 DIAGNOSIS — A690 Necrotizing ulcerative stomatitis: Secondary | ICD-10-CM | POA: Diagnosis present

## 2015-08-29 DIAGNOSIS — R188 Other ascites: Secondary | ICD-10-CM | POA: Diagnosis present

## 2015-08-29 DIAGNOSIS — F17200 Nicotine dependence, unspecified, uncomplicated: Secondary | ICD-10-CM | POA: Diagnosis present

## 2015-08-29 DIAGNOSIS — Z9889 Other specified postprocedural states: Secondary | ICD-10-CM

## 2015-08-29 DIAGNOSIS — Y95 Nosocomial condition: Secondary | ICD-10-CM | POA: Diagnosis present

## 2015-08-29 DIAGNOSIS — G40909 Epilepsy, unspecified, not intractable, without status epilepticus: Secondary | ICD-10-CM | POA: Diagnosis present

## 2015-08-29 DIAGNOSIS — R42 Dizziness and giddiness: Secondary | ICD-10-CM | POA: Diagnosis present

## 2015-08-29 DIAGNOSIS — Z9981 Dependence on supplemental oxygen: Secondary | ICD-10-CM | POA: Diagnosis not present

## 2015-08-29 HISTORY — DX: Unspecified asthma, uncomplicated: J45.909

## 2015-08-29 LAB — CBC WITH DIFFERENTIAL/PLATELET
BASOS PCT: 0 %
Basophils Absolute: 0 10*3/uL (ref 0–0.1)
Eosinophils Absolute: 0 10*3/uL (ref 0–0.7)
Eosinophils Relative: 0 %
HEMATOCRIT: 42.7 % (ref 40.0–52.0)
HEMOGLOBIN: 13.8 g/dL (ref 13.0–18.0)
LYMPHS ABS: 0.8 10*3/uL — AB (ref 1.0–3.6)
LYMPHS PCT: 6 %
MCH: 28 pg (ref 26.0–34.0)
MCHC: 32.3 g/dL (ref 32.0–36.0)
MCV: 86.7 fL (ref 80.0–100.0)
MONO ABS: 0.7 10*3/uL (ref 0.2–1.0)
Monocytes Relative: 6 %
NEUTROS ABS: 10.7 10*3/uL — AB (ref 1.4–6.5)
NEUTROS PCT: 88 %
Platelets: 224 10*3/uL (ref 150–440)
RBC: 4.93 MIL/uL (ref 4.40–5.90)
RDW: 15.4 % — ABNORMAL HIGH (ref 11.5–14.5)
WBC: 12.2 10*3/uL — ABNORMAL HIGH (ref 3.8–10.6)

## 2015-08-29 LAB — URINALYSIS COMPLETE WITH MICROSCOPIC (ARMC ONLY)
Bilirubin Urine: NEGATIVE
Glucose, UA: NEGATIVE mg/dL
Leukocytes, UA: NEGATIVE
NITRITE: NEGATIVE
SPECIFIC GRAVITY, URINE: 1.027 (ref 1.005–1.030)
pH: 5 (ref 5.0–8.0)

## 2015-08-29 LAB — COMPREHENSIVE METABOLIC PANEL
ALBUMIN: 3.6 g/dL (ref 3.5–5.0)
ALT: 13 U/L — AB (ref 17–63)
AST: 38 U/L (ref 15–41)
Alkaline Phosphatase: 77 U/L (ref 38–126)
Anion gap: 10 (ref 5–15)
BUN: 22 mg/dL — AB (ref 6–20)
CHLORIDE: 105 mmol/L (ref 101–111)
CO2: 24 mmol/L (ref 22–32)
CREATININE: 0.99 mg/dL (ref 0.61–1.24)
Calcium: 8.7 mg/dL — ABNORMAL LOW (ref 8.9–10.3)
GFR calc Af Amer: >60 mL/min (ref >60)
GFR calc non Af Amer: >60 mL/min (ref >60)
GLUCOSE: 122 mg/dL — AB (ref 65–99)
POTASSIUM: 4.8 mmol/L (ref 3.5–5.1)
SODIUM: 139 mmol/L (ref 135–145)
Total Bilirubin: 1.5 mg/dL — ABNORMAL HIGH (ref 0.3–1.2)
Total Protein: 7.2 g/dL (ref 6.5–8.1)

## 2015-08-29 LAB — URINE DRUG SCREEN, QUALITATIVE (ARMC ONLY)
Amphetamines, Ur Screen: NOT DETECTED
BENZODIAZEPINE, UR SCRN: NOT DETECTED
Barbiturates, Ur Screen: NOT DETECTED
CANNABINOID 50 NG, UR ~~LOC~~: NOT DETECTED
Cocaine Metabolite,Ur ~~LOC~~: NOT DETECTED
MDMA (Ecstasy)Ur Screen: NOT DETECTED
Methadone Scn, Ur: NOT DETECTED
Opiate, Ur Screen: NOT DETECTED
PHENCYCLIDINE (PCP) UR S: NOT DETECTED
TRICYCLIC, UR SCREEN: NOT DETECTED

## 2015-08-29 LAB — PROTIME-INR
INR: 1.24
Prothrombin Time: 15.8 seconds — ABNORMAL HIGH (ref 11.4–15.0)

## 2015-08-29 LAB — SALICYLATE LEVEL

## 2015-08-29 LAB — ETHANOL

## 2015-08-29 LAB — ACETAMINOPHEN LEVEL

## 2015-08-29 LAB — LIPASE, BLOOD: Lipase: 18 U/L (ref 11–51)

## 2015-08-29 MED ORDER — PIPERACILLIN-TAZOBACTAM 3.375 G IVPB
3.3750 g | Freq: Once | INTRAVENOUS | Status: AC
  Administered 2015-08-29: 3.375 g via INTRAVENOUS
  Filled 2015-08-29: qty 50

## 2015-08-29 MED ORDER — ONDANSETRON HCL 4 MG PO TABS: 4.0000 mg | ORAL_TABLET | Freq: Four times a day (QID) | ORAL | Status: DC | PRN

## 2015-08-29 MED ORDER — LEVOFLOXACIN IN D5W 750 MG/150ML IV SOLN
750.0000 mg | Freq: Once | INTRAVENOUS | Status: AC
  Administered 2015-08-29: 750 mg via INTRAVENOUS
  Filled 2015-08-29 (×2): qty 150

## 2015-08-29 MED ORDER — FUROSEMIDE 40 MG PO TABS
40.0000 mg | ORAL_TABLET | Freq: Every day | ORAL | Status: DC
  Administered 2015-08-29 – 2015-09-04 (×7): 40 mg via ORAL
  Filled 2015-08-29 (×7): qty 1

## 2015-08-29 MED ORDER — ENOXAPARIN SODIUM 40 MG/0.4ML ~~LOC~~ SOLN
40.0000 mg | SUBCUTANEOUS | Status: DC
  Administered 2015-08-29 – 2015-09-04 (×6): 40 mg via SUBCUTANEOUS
  Filled 2015-08-29 (×6): qty 0.4

## 2015-08-29 MED ORDER — SODIUM CHLORIDE 0.9 % IV BOLUS (SEPSIS)
1000.0000 mL | Freq: Once | INTRAVENOUS | Status: AC
  Administered 2015-08-29: 1000 mL via INTRAVENOUS

## 2015-08-29 MED ORDER — ACETAMINOPHEN 325 MG PO TABS: 650.0000 mg | ORAL_TABLET | Freq: Four times a day (QID) | ORAL | Status: DC | PRN

## 2015-08-29 MED ORDER — METRONIDAZOLE 500 MG PO TABS
500.0000 mg | ORAL_TABLET | Freq: Two times a day (BID) | ORAL | Status: DC
Start: 2015-08-29 — End: 2015-08-29

## 2015-08-29 MED ORDER — ACETAMINOPHEN 650 MG RE SUPP: 650.0000 mg | Freq: Four times a day (QID) | RECTAL | Status: DC | PRN

## 2015-08-29 MED ORDER — MOMETASONE FURO-FORMOTEROL FUM 100-5 MCG/ACT IN AERO
2.0000 | INHALATION_SPRAY | Freq: Two times a day (BID) | RESPIRATORY_TRACT | Status: DC
  Administered 2015-08-29 – 2015-09-04 (×11): 2 via RESPIRATORY_TRACT
  Filled 2015-08-29: qty 8.8

## 2015-08-29 MED ORDER — ALBUTEROL SULFATE (2.5 MG/3ML) 0.083% IN NEBU
2.5000 mg | INHALATION_SOLUTION | Freq: Four times a day (QID) | RESPIRATORY_TRACT | Status: DC
  Administered 2015-08-30 (×4): 2.5 mg via RESPIRATORY_TRACT
  Filled 2015-08-29 (×4): qty 3

## 2015-08-29 MED ORDER — ONDANSETRON HCL 4 MG/2ML IJ SOLN: 4.0000 mg | Freq: Four times a day (QID) | INTRAMUSCULAR | Status: DC | PRN

## 2015-08-29 MED ORDER — PNEUMOCOCCAL VAC POLYVALENT 25 MCG/0.5ML IJ INJ: 0.5000 mL | INJECTION | INTRAMUSCULAR | Status: DC

## 2015-08-29 MED ORDER — LEVETIRACETAM 500 MG PO TABS
1000.0000 mg | ORAL_TABLET | Freq: Once | ORAL | Status: AC
  Administered 2015-08-29: 1000 mg via ORAL
  Filled 2015-08-29: qty 2

## 2015-08-29 MED ORDER — PIPERACILLIN-TAZOBACTAM 3.375 G IVPB
3.3750 g | Freq: Three times a day (TID) | INTRAVENOUS | Status: DC
  Administered 2015-08-29 – 2015-09-02 (×11): 3.375 g via INTRAVENOUS
  Filled 2015-08-29 (×14): qty 50

## 2015-08-29 MED ORDER — IPRATROPIUM-ALBUTEROL 0.5-2.5 (3) MG/3ML IN SOLN
3.0000 mL | RESPIRATORY_TRACT | Status: DC | PRN
  Administered 2015-08-29 – 2015-08-31 (×4): 3 mL via RESPIRATORY_TRACT
  Filled 2015-08-29 (×4): qty 3

## 2015-08-29 MED ORDER — IOHEXOL 300 MG/ML  SOLN
75.0000 mL | Freq: Once | INTRAMUSCULAR | Status: AC | PRN
  Administered 2015-08-29: 75 mL via INTRAVENOUS

## 2015-08-29 MED ORDER — LEVOFLOXACIN IN D5W 750 MG/150ML IV SOLN
750.0000 mg | INTRAVENOUS | Status: DC
  Administered 2015-08-30 – 2015-09-02 (×4): 750 mg via INTRAVENOUS
  Filled 2015-08-29 (×5): qty 150

## 2015-08-29 MED ORDER — SODIUM CHLORIDE 0.9 % IV SOLN: Freq: Once | INTRAVENOUS | Status: DC

## 2015-08-29 MED ORDER — FLUOXETINE HCL 10 MG PO CAPS
10.0000 mg | ORAL_CAPSULE | Freq: Every day | ORAL | Status: DC
  Administered 2015-08-30 – 2015-09-04 (×6): 10 mg via ORAL
  Filled 2015-08-29 (×7): qty 1

## 2015-08-29 MED ORDER — VANCOMYCIN HCL IN DEXTROSE 1-5 GM/200ML-% IV SOLN
1000.0000 mg | Freq: Once | INTRAVENOUS | Status: AC
  Administered 2015-08-29: 1000 mg via INTRAVENOUS
  Filled 2015-08-29: qty 200

## 2015-08-29 MED ORDER — HYDROCODONE-ACETAMINOPHEN 5-325 MG PO TABS
1.0000 | ORAL_TABLET | ORAL | Status: DC | PRN
  Filled 2015-08-29: qty 1

## 2015-08-29 MED ORDER — LEVETIRACETAM 500 MG PO TABS
500.0000 mg | ORAL_TABLET | Freq: Two times a day (BID) | ORAL | Status: DC
  Administered 2015-08-29 – 2015-09-04 (×12): 500 mg via ORAL
  Filled 2015-08-29 (×12): qty 1

## 2015-08-29 MED ORDER — SENNOSIDES-DOCUSATE SODIUM 8.6-50 MG PO TABS: 1.0000 | ORAL_TABLET | Freq: Every evening | ORAL | Status: DC | PRN

## 2015-08-29 NOTE — ED Notes (Signed)
Patient transported to CT 

## 2015-08-29 NOTE — ED Notes (Signed)
[facility] and possible seizure. Family came to take patient to doctor's appt this AM and found patient in home and he was confused. Pt hx of seizures. Pt unable to state if he had seizure or not. Hematoma to left forehead. Pt unsure if he fell or not. Pt knows his  Name and place, disoriented to time and situation.

## 2015-08-29 NOTE — ED Provider Notes (Signed)
[facility] Emergency Department Provider Note  ____________________________________________  Time seen: 9:30 AM  I have reviewed the triage vital signs and the nursing notes.   HISTORY  Chief Complaint Altered Mental Status  History obtained from family and patient  HPI George Smith is a 59 y.o. male brought to the ED due to being found down and confused lying on the floor this morning by his family. Obvious head trauma with a contusion over the left frontoparietal area. EMS report that the family related that the patient was confused and unable to state where he was when they found him.   Family at the bedside reports that the patient is unable to take care of himself and manages medications alone. Additionally, his wife does not help him and so he is frequently noncompliant with his medications. This includes Keppra for seizure disorder.  Patient family reports that the patient has been severely short of breath when he walks and gets dizzy 2.  He was recently admitted 1-2 months ago for pleural effusion requiring thoracentesis.   Past Medical History  Diagnosis Date  . COPD (chronic obstructive pulmonary disease) (HCC)   . 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 Lung disease     spots on lungs that have not been checked on  . Seizures (HCC) 05/2015  . Heart disease   . Headache     migraines  . Depression   . Anxiety      Patient Active Problem List   Diagnosis Date Noted  . Pain in joint, shoulder region 07/19/2015  . Bilateral shoulder pain 07/19/2015  . S/P thoracentesis   . CAP (community acquired pneumonia) 07/06/2015  . Tobacco use disorder 07/06/2015  . Protein-calorie malnutrition, severe (HCC) 07/06/2015  . Right middle lobe pneumonia 07/06/2015  . Right lower lobe pneumonia 07/06/2015  . Sepsis (HCC) 07/06/2015  . Sepsis due to pneumonia (HCC) 07/06/2015  . Seizure (HCC) 07/05/2015  . Abnormal chest x-ray  07/05/2015  . COPD (chronic obstructive pulmonary disease) (HCC) 07/05/2015  . Pleural effusion 07/05/2015  . Leukocytosis 07/05/2015  . Noncompliance 07/05/2015     Past Surgical History  Procedure Laterality Date  . No past surgeries       Current Outpatient Rx  Name  Route  Sig  Dispense  Refill  . FLUoxetine (PROZAC) 10 MG capsule   Oral   Take 1 capsule (10 mg total) by mouth daily.   30 capsule   1   . Fluticasone-Salmeterol (ADVAIR) 250-50 MCG/DOSE AEPB   Inhalation   Inhale 1 puff into the lungs 2 (two) times daily.         . furosemide (LASIX) 40 MG tablet   Oral   Take 40 mg by mouth daily.         Marland Kitchen ibuprofen (ADVIL,MOTRIN) 600 MG tablet   Oral   Take 1 tablet (600 mg total) by mouth every 8 (eight) hours as needed.   60 tablet   1   . ipratropium-albuterol (DUONEB) 0.5-2.5 (3) MG/3ML SOLN   Nebulization   Take 3 mLs by nebulization every 4 (four) hours as needed.   360 mL   1   . levETIRAcetam (KEPPRA) 500 MG tablet   Oral   Take 1 tablet (500 mg total) by mouth 2 (two) times daily.   60 tablet   12      Allergies Review of patient's allergies indicates no known allergies.   Family  History  Problem Relation Age of Onset  . Diabetes Mother   . Seizures Neg Hx     Social History Social History  Substance Use Topics  . Smoking status: Former Smoker -- 1.00 packs/day for 58 years    Types: Cigarettes    Quit date: 05/18/2015  . Smokeless tobacco: Never Used  . Alcohol Use: No    Review of Systems  Constitutional:   No fever or chills. No weight changes Eyes:   No blurry vision or double vision.  ENT:   No sore throat. Cardiovascular:   No chest pain. Respiratory:   Positive shortness of breath and nonproductive cough Gastrointestinal:   Negative for abdominal pain, vomiting and diarrhea.  No BRBPR or melena. Genitourinary:   Negative for dysuria, urinary retention, bloody urine, or difficulty urinating. Musculoskeletal:    Negative for back pain. No joint swelling or pain. Skin:   Negative for rash. Neurological:   Negative for headaches, focal weakness or numbness. Positive dizziness Psychiatric:  No anxiety or depression.   Endocrine:  No hot/cold intolerance, changes in energy, or sleep difficulty.  10-point ROS otherwise negative.  ____________________________________________   PHYSICAL EXAM:  VITAL SIGNS: ED Triage Vitals  Enc Vitals Group     BP 08/29/15 0928 151/105 mmHg     Pulse Rate 08/29/15 0928 103     Resp 08/29/15 0928 18     Temp 08/29/15 0928 97.6 F (36.4 C)     Temp Source 08/29/15 0928 Oral     SpO2 08/29/15 0928 95 %     Weight 08/29/15 0928 140 lb (63.504 kg)     Height 08/29/15 0928  (1.702 m)     Head Cir --      Peak Flow --      Pain Score --      Pain Loc --      Pain Edu? --      Excl. in GC? --      Constitutional:   Alert and oriented 2. Ill-appearing, mild distress Eyes:   No scleral icterus. No conjunctival pallor. PERRL. EOMI. There is mild periorbital edema ENT   Head:   Normocephalic with mild swelling and contusion in the left forehead and temporal area.   Nose:   No congestion/rhinnorhea. No septal hematoma   Mouth/Throat:   Dry mucous membranes, no pharyngeal erythema. No peritonsillar mass. No uvula shift. There is a small tongue abrasion on the right side of the tongue, hemostatic. There are diffuse ulcerative plaques on the gingiva with very foul breath   Neck:   No stridor. No SubQ emphysema. No meningismus. Hematological/Lymphatic/Immunilogical:   No cervical lymphadenopathy. Cardiovascular:   Tachycardia heart rate 105. Normal and symmetric distal pulses are present in all extremities. No murmurs, rubs, or gallops. Respiratory:   Tachypnea with diminished breath sounds at the right base. Gastrointestinal:   Soft and nontender. No distention. There is no CVA tenderness.  No rebound, rigidity, or guarding. Genitourinary:    deferred Musculoskeletal:   Nontender with normal range of motion in all extremities. No joint effusions.  No lower extremity tenderness.  No edema. Neurologic:   Normal speech and language.  CN 2-10 normal. Motor grossly intact. No pronator drift. Dizziness on standing, follows commands  No gross focal neurologic deficits are appreciated.  Skin:    Skin is warm, dry and intact. No rash noted.  No petechiae, purpura, or bullae. Psychiatric:   Mood and affect are normal. Speech and behavior are  normal. Patient exhibits appropriate insight and judgment.  ____________________________________________    LABS (pertinent positives/negatives) (all labs ordered are listed, but only abnormal results are displayed) Labs Reviewed  COMPREHENSIVE METABOLIC PANEL - Abnormal; Notable for the following:    Glucose, Bld 122 (*)    BUN 22 (*)    Calcium 8.7 (*)    ALT 13 (*)    Total Bilirubin 1.5 (*)    All other components within normal limits  CBC WITH DIFFERENTIAL/PLATELET - Abnormal; Notable for the following:    WBC 12.2 (*)    RDW 15.4 (*)    Neutro Abs 10.7 (*)    Lymphs Abs 0.8 (*)    All other components within normal limits  URINALYSIS COMPLETEWITH MICROSCOPIC ([facility] ONLY) - Abnormal; Notable for the following:    Color, Urine YELLOW (*)    APPearance HAZY (*)    Ketones, ur 1+ (*)    Hgb urine dipstick 1+ (*)    Protein, ur >500 (*)    Bacteria, UA RARE (*)    Squamous Epithelial / LPF 0-5 (*)    All other components within normal limits  ACETAMINOPHEN LEVEL - Abnormal; Notable for the following:    Acetaminophen (Tylenol), Serum <10 (*)    All other components within normal limits  PROTIME-INR - Abnormal; Notable for the following:    Prothrombin Time 15.8 (*)    All other components within normal limits  ETHANOL  LIPASE, BLOOD  URINE DRUG SCREEN, QUALITATIVE ([facility] ONLY)  SALICYLATE LEVEL   ____________________________________________   EKG  Interpreted by me Sinus  tachycardia rate 101, right axis, prolonged QTC. Normal QRS and ST segments and T waves  ____________________________________________    RADIOLOGY  CT head and orbits unremarkable. Chest x-ray shows large right-sided pneumonia with underlying pleural effusion  ____________________________________________   PROCEDURES CRITICAL CARE Performed by: Scotty CourtSTAFFORD, Jaymarie Yeakel   Total critical care time: 35 minutes  Critical care time was exclusive of separately billable procedures and treating other patients.  Critical care was necessary to treat or prevent imminent or life-threatening deterioration.  Critical care was time spent personally by me on the following activities: development of treatment plan with patient and/or surrogate as well as nursing, discussions with consultants, evaluation of patient's response to treatment, examination of patient, obtaining history from patient or surrogate, ordering and performing treatments and interventions, ordering and review of laboratory studies, ordering and review of radiographic studies, pulse oximetry and re-evaluation of patient's condition.   ____________________________________________   INITIAL IMPRESSION / ASSESSMENT AND PLAN / ED COURSE  Pertinent labs & imaging results that were available during my care of the patient were reviewed by me and considered in my medical decision making (see chart for details).  Patient presents with altered mental status tachycardia and tachypnea, likely related to dehydration and recent seizure due to medication noncompliance. We'll also workup with chest x-ray. An CT scan of the head and orbits. Low suspicion for orbital cellulitis but we will evaluate with the swelling. Most likely the swelling is due to the mechanical trauma in that area of the skull. Oral findings also suggest acute necrotizing ulcerative gingivitis.  We'll give IV fluids while getting a  workup.  ----------------------------------------- 1:54 PM on 08/29/2015 -----------------------------------------  Workup reveals a large right-sided pneumonia with pleural effusion. Blood pressure and oxygenation remained adequate, but he remains tachycardic to 110 and tachypnea. We'll continue with IV fluids and give IV antibiotics or healthcare associated pneumonia with this recent thoracentesis and hospital admission  with pulmonary complaints. I discussed this with the hospitalist will evaluate him in the ED. Repeat assessment the patient is alert and oriented 3 and following commands. Possibly earlier on he was still post ictal as a cause of his confusion which is now resolved. The patient does have vital sign abnormalities consistent with early sepsis in the setting of his significant pneumonia. Vancomycin and Zosyn and Levaquin IV ordered. We'll also order to Flagyl for the ulcerative gingivitis of the mouth.     ____________________________________________   FINAL CLINICAL IMPRESSION(S) / ED DIAGNOSES  Final diagnoses:  HCAP (healthcare-associated pneumonia)   acute necrotizing ulcerative gingivitis  Sharman Cheek, MD 08/29/15 1356

## 2015-08-29 NOTE — ED Notes (Signed)
Pt back from CT

## 2015-08-29 NOTE — Progress Notes (Signed)
ANTIBIOTIC CONSULT NOTE - INITIAL  Pharmacy Consult for ZOSYN/ LEVAQUIN Indication: Pneumonia/?HCAP  No Known Allergies  Patient Measurements: Height:  (170.2 cm) Weight: 140 lb (63.504 kg) IBW/kg (Calculated) : 66.1 Adjusted Body Weight:   Vital Signs: Temp: 97.6 F (36.4 C) (11/07 0928) Temp Source: Oral (11/07 0928) BP: 137/92 mmHg (11/07 1340) Pulse Rate: 105 (11/07 1340) Intake/Output from previous day:   Intake/Output from this shift:    Labs:  Recent Labs  08/29/15 0936  WBC 12.2*  HGB 13.8  PLT 224  CREATININE 0.99   Estimated Creatinine Clearance: 71.3 mL/min (by C-G formula based on Cr of 0.99).   Microbiology: No results found for this or any previous visit (from the past 720 hour(s)).  Medical History: Past Medical History  Diagnosis Date  . COPD (chronic obstructive pulmonary disease) (HCC)   . 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 Lung disease     spots on lungs that have not been checked on  . Seizures (HCC) 05/2015  . Heart disease   . Headache     migraines  . Depression   . Anxiety     Medications:  Scheduled:  . sodium chloride   Intravenous Once  . albuterol  2.5 mg Nebulization Q6H  . enoxaparin (LOVENOX) injection  40 mg Subcutaneous Q24H  . FLUoxetine  10 mg Oral Daily  . furosemide  40 mg Oral Daily  . levETIRAcetam  500 mg Oral BID  . levofloxacin (LEVAQUIN) IV  750 mg Intravenous Once  . [START ON 08/30/2015] levofloxacin (LEVAQUIN) IV  750 mg Intravenous Q24H  . mometasone-formoterol  2 puff Inhalation BID  . piperacillin-tazobactam (ZOSYN)  IV  3.375 g Intravenous Once  . piperacillin-tazobactam (ZOSYN)  IV  3.375 g Intravenous 3 times per day  . [START ON 08/30/2015] pneumococcal 23 valent vaccine  0.5 mL Intramuscular Tomorrow-1000   Anti-infectives    Start     Dose/Rate Route Frequency Ordered Stop   08/30/15 1600  levofloxacin (LEVAQUIN) IVPB 750 mg     750 mg 100 mL/hr over 90 Minutes  Intravenous Every 24 hours 08/29/15 1559     08/29/15 2200  piperacillin-tazobactam (ZOSYN) IVPB 3.375 g     3.375 g 12.5 mL/hr over 240 Minutes Intravenous 3 times per day 08/29/15 1602     08/29/15 1400  metroNIDAZOLE (FLAGYL) tablet 500 mg  Status:  Discontinued     500 mg Oral Every 12 hours 08/29/15 1356 08/29/15 1533   08/29/15 1345  vancomycin (VANCOCIN) IVPB 1000 mg/200 mL premix     1,000 mg 200 mL/hr over 60 Minutes Intravenous  Once 08/29/15 1343 08/29/15 1449   08/29/15 1345  piperacillin-tazobactam (ZOSYN) IVPB 3.375 g     3.375 g 12.5 mL/hr over 240 Minutes Intravenous  Once 08/29/15 1343     08/29/15 1345  levofloxacin (LEVAQUIN) IVPB 750 mg     750 mg 100 mL/hr over 90 Minutes Intravenous  Once 08/29/15 1343       Assessment: 59 y.o. male withCOPD with ongoing tobacco abuse, history of seizure disorder, depression,anxiety with generalized weakness cough with whitish yellowish phlegm, SOB for more than 2 weeks.  Patient with Sepsis, CXR: consolidation/PNA? Pt received Vancomycin x 1, Zosyn x 1 in ER , to continue with Zosyn, Levaquin.  Plan:  Follow up culture results  Patient to receive Levaquin  IV x 1. Will continue with Levaquin  IV Q24h. Patient received Zosyn 3.375 gm IV  x1 in ER.  Will continue with EI Zosyn 3.375gm IV q8h.  Bari MantisKristin Tunya Held PharmD Clinical Pharmacist 08/29/2015  4:19 PM

## 2015-08-29 NOTE — ED Notes (Signed)
CBG 119 per EMS

## 2015-08-29 NOTE — ED Notes (Signed)
MD at bedside. 

## 2015-08-29 NOTE — H&P (Signed)
[facility] - [place] [facility]   PATIENT NAME: George Smith    MR#:  [id number]  DATE OF BIRTH:  09-23-1956  DATE OF ADMISSION:  08/29/2015  PRIMARY CARE PHYSICIAN: Dr Lacie Scotts   REQUESTING/REFERRING PHYSICIAN: Dr Scotty Court  CHIEF COMPLAINT:  Not feeling well overall shortness of breath and cough for more than 2 weeks  HISTORY OF PRESENT ILLNESS:  Aithan Farrelly  is a 59 y.o. male with a known history of COPD with ongoing tobacco abuse, history of seizure disorder, depression and anxiety comes to the emergency room with generalized weakness cough with whitish yellowish phlegm and shortness of breath for more than 2 weeks. Patient was found to have right middle lobe lower lobe pneumonia. He received IV antibiotics in the emergency room. He is being admitted for acute on chronic respiratory failure secondary to right-sided pneumonia. His sats are 92-96% on room air. He is tachycardic with heart rate in the 100s and tachypneic with respiratory rate 21-27.  PAST MEDICAL HISTORY:   Past Medical History  Diagnosis Date  . COPD (chronic obstructive pulmonary disease) (HCC)   . 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 Lung disease     spots on lungs that have not been checked on  . Seizures (HCC) 05/2015  . Heart disease   . Headache     migraines  . Depression   . Anxiety     PAST SURGICAL HISTOIRY:   Past Surgical History  Procedure Laterality Date  . No past surgeries      SOCIAL HISTORY:   Social History  Substance Use Topics  . Smoking status: Former Smoker -- 1.00 packs/day for 58 years    Types: Cigarettes    Quit date: 05/18/2015  . Smokeless tobacco: Never Used  . Alcohol Use: No    FAMILY HISTORY:   Family History  Problem Relation Age of Onset  . Diabetes Mother   . Seizures Neg Hx     DRUG ALLERGIES:  No Known Allergies  REVIEW OF SYSTEMS:  Review of Systems  Constitutional: Negative for fever, chills and  weight loss.  HENT: Negative for ear discharge, ear pain and nosebleeds.   Eyes: Negative for blurred vision, pain and discharge.  Respiratory: Positive for cough, sputum production and shortness of breath. Negative for wheezing and stridor.   Cardiovascular: Negative for chest pain, palpitations, orthopnea and PND.  Gastrointestinal: Negative for nausea, vomiting, abdominal pain and diarrhea.  Genitourinary: Negative for urgency and frequency.  Musculoskeletal: Negative for back pain and joint pain.  Neurological: Positive for weakness. Negative for sensory change, speech change and focal weakness.  Psychiatric/Behavioral: Negative for depression and hallucinations. The patient is nervous/anxious.   All other systems reviewed and are negative.    MEDICATIONS AT HOME:   Prior to Admission medications   Medication Sig Start Date End Date Taking? Authorizing Provider  FLUoxetine (PROZAC) 10 MG capsule Take 1 capsule (10 mg total) by mouth daily. 07/19/15  Yes Jaclyn Shaggy, MD  Fluticasone-Salmeterol (ADVAIR) 250-50 MCG/DOSE AEPB Inhale 1 puff into the lungs 2 (two) times daily.   Yes Historical Provider, MD  furosemide (LASIX) 40 MG tablet Take 40 mg by mouth daily.   Yes Historical Provider, MD  ibuprofen (ADVIL,MOTRIN) 600 MG tablet Take 1 tablet (600 mg total) by mouth every 8 (eight) hours as needed. 07/19/15  Yes Jaclyn Shaggy, MD  ipratropium-albuterol (DUONEB) 0.5-2.5 (3) MG/3ML SOLN Take 3 mLs by nebulization every  4 (four) hours as needed. 07/19/15  Yes Jaclyn Shaggy, MD  levETIRAcetam (KEPPRA) 500 MG tablet Take 1 tablet (500 mg total) by mouth 2 (two) times daily. 08/03/15  Yes Suanne Marker, MD      VITAL SIGNS:  Blood pressure 137/92, pulse 105, temperature 97.6 F (36.4 C), temperature source Oral, resp. rate 27, height 5\' 7"  (1.702 m), weight 63.504 kg (140 lb), SpO2 96 %.  PHYSICAL EXAMINATION:  GENERAL:  59 y.o.-year-old patient lying in the bed with no acute distress.  Disheveled, poor hygiene EYES: Pupils equal, round, reactive to light and accommodation. No scleral icterus. Extraocular muscles intact.  HEENT: Head atraumatic, normocephalic. Oropharynx and nasopharynx clear.  NECK:  Supple, no jugular venous distention. No thyroid enlargement, no tenderness.  LUNGS: Normal breath sounds bilaterally, no wheezing, rales,rhonchi or crepitation. No use of accessory muscles of respiration.  CARDIOVASCULAR: S1, S2 normal. No murmurs, rubs, or gallops.  ABDOMEN: Soft, nontender, nondistended. Bowel sounds present. No organomegaly or mass.  EXTREMITIES: No pedal edema, cyanosis, or clubbing.  NEUROLOGIC: Cranial nerves II through XII are intact. Muscle strength 5/5 in all extremities. Sensation intact. Gait not checked.  PSYCHIATRIC: The patient is alert and oriented x 3.  SKIN: No obvious rash, lesion, or ulcer.   LABORATORY PANEL:   CBC  Recent Labs Lab 08/29/15 0936  WBC 12.2*  HGB 13.8  HCT 42.7  PLT 224   ------------------------------------------------------------------------------------------------------------------  Chemistries   Recent Labs Lab 08/29/15 0936  NA 139  K 4.8  CL 105  CO2 24  GLUCOSE 122*  BUN 22*  CREATININE 0.99  CALCIUM 8.7*  AST 38  ALT 13*  ALKPHOS 77  BILITOT 1.5*   ------------------------------------------------------------------------------------------------------------------  Cardiac Enzymes No results for input(s): TROPONINI in the last 168 hours. ------------------------------------------------------------------------------------------------------------------  RADIOLOGY:  Dg Chest 2 View  08/29/2015  CLINICAL DATA:  Severe shortness of breath beginning approximately 2 weeks ago, worsening. Initial encounter. EXAM: CHEST  2 VIEW COMPARISON:  None. FINDINGS: The patient has a moderate to moderately large right pleural effusion with extensive airspace disease in the right middle and lower lobes. The left  lung is clear. Heart size is scratch the cardiac silhouette is partially is obscured. No pneumothorax identified. IMPRESSION: Right pleural effusion noted extensive airspace disease likely due to pneumonia. Recommend followup to clearing. Electronically Signed   By: Drusilla Kanner M.D.   On: 08/29/2015 13:04   Ct Head Wo Contrast  08/29/2015  CLINICAL DATA:  Status post fall this morning. Hematoma left side of the forehead. Blurry vision left eye. Swelling and redness of the left eye lid. Initial encounter. EXAM: CT HEAD WITHOUT CONTRAST AND ORBITS WITH CONTRAST TECHNIQUE: Contiguous axial images were obtained from the base of the skull through the vertex without contrast. Multidetector CT imaging of the orbits was performed using the standard protocol with and without intravenous contrast. CONTRAST:  75 mL OMNIPAQUE IOHEXOL 300 MG/ML  SOLN COMPARISON:  None. FINDINGS: CT HEAD FINDINGS There is no evidence of acute intracranial abnormality including hemorrhage, infarct, mass lesion, mass effect, midline shift or abnormal extra-axial fluid collection. No hydrocephalus or pneumocephalus. The calvarium is intact. CT ORBITS FINDINGS The globes are intact and the lenses are located bilaterally. Orbital fat is clear. No soft tissue gas collection or radiopaque foreign body is identified. Extraocular muscles and optic nerves are unremarkable in appearance. Reported hematoma on the left side of the forehead is difficult to visualize on this exam. Mild ethmoid air cell disease is seen.  Imaged cranial sinuses are otherwise clear. The mastoid air cells are clear. Major caliber vascular structures demonstrate atherosclerotic vascular disease are otherwise unremarkable. No lymphadenopathy is identified. No facial bone fracture is identified. The mandibular condyles are located. Multiple cavities are partially visualized and periapical lucencies are seen about multiple upper teeth. IMPRESSION: No acute abnormality head or  orbits. Hematoma the left side of the forehead is not seen on these exams. Extensive dental disease. Electronically Signed   By: Drusilla Kannerhomas  Dalessio M.D.   On: 08/29/2015 12:42   Ct Chest Wo Contrast  08/29/2015  CLINICAL DATA:  Right pleural effusion EXAM: CT CHEST WITHOUT CONTRAST TECHNIQUE: Multidetector CT imaging of the chest was performed following the standard protocol without IV contrast. COMPARISON:  Chest x-ray earlier today FINDINGS: There is a moderate right pleural effusion, partially loculated laterally. Small left pleural effusion which appears free flowing. Right middle and lower lobe airspace disease could reflect atelectasis or infiltrate. Left lung is clear. Heart is mildly enlarged. Scattered coronary artery calcifications and aortic calcifications. No aneurysm. Mildly enlarged mediastinal lymph nodes. Index right peritracheal node has a short axis diameter of 11 mm on image 21. Numerous right paratracheal, pre tracheal, sub carina and prevascular lymph nodes. No axillary or visible hilar adenopathy. Imaging into the upper abdomen shows mild perihepatic and perisplenic ascites. No acute bony abnormality or focal bone lesion. IMPRESSION: Moderate right pleural effusion, partially loculated laterally. Small free-flowing left pleural effusion. Right middle and lower lobe atelectasis or consolidation/ pneumonia. Cardiomegaly, coronary artery disease. Mild mediastinal adenopathy. This could be reactive. This could be followed with repeat CT in 3-6 months. Electronically Signed   By: Charlett NoseKevin  Dover M.D.   On: 08/29/2015 14:43   Ct Orbits W/cm  08/29/2015  CLINICAL DATA:  Status post fall this morning. Hematoma left side of the forehead. Blurry vision left eye. Swelling and redness of the left eye lid. Initial encounter. EXAM: CT HEAD WITHOUT CONTRAST AND ORBITS WITH CONTRAST TECHNIQUE: Contiguous axial images were obtained from the base of the skull through the vertex without contrast. Multidetector  CT imaging of the orbits was performed using the standard protocol with and without intravenous contrast. CONTRAST:  75 mL OMNIPAQUE IOHEXOL 300 MG/ML  SOLN COMPARISON:  None. FINDINGS: CT HEAD FINDINGS There is no evidence of acute intracranial abnormality including hemorrhage, infarct, mass lesion, mass effect, midline shift or abnormal extra-axial fluid collection. No hydrocephalus or pneumocephalus. The calvarium is intact. CT ORBITS FINDINGS The globes are intact and the lenses are located bilaterally. Orbital fat is clear. No soft tissue gas collection or radiopaque foreign body is identified. Extraocular muscles and optic nerves are unremarkable in appearance. Reported hematoma on the left side of the forehead is difficult to visualize on this exam. Mild ethmoid air cell disease is seen. Imaged cranial sinuses are otherwise clear. The mastoid air cells are clear. Major caliber vascular structures demonstrate atherosclerotic vascular disease are otherwise unremarkable. No lymphadenopathy is identified. No facial bone fracture is identified. The mandibular condyles are located. Multiple cavities are partially visualized and periapical lucencies are seen about multiple upper teeth. IMPRESSION: No acute abnormality head or orbits. Hematoma the left side of the forehead is not seen on these exams. Extensive dental disease. Electronically Signed   By: Drusilla Kannerhomas  Dalessio M.D.   On: 08/29/2015 12:42    EKG:   Sinus tachycardia IMPRESSION AND PLAN:   Marcelline Deisthomas Krabbenhoft  is a 59 y.o. male with a known history of COPD with ongoing tobacco abuse,  history of seizure disorder, depression and anxiety comes to the emergency room with generalized weakness cough with whitish yellowish phlegm and shortness of breath for more than 2 weeks.  1. Sepsis secondary to right middle lobe lower lobe pneumonia. Patient meets sepsis criteria by T Cardia, tachypnea, elevated white count, and abnormal chest x-ray Patient received  vancomycin and Zosyn and Levaquin in the ER. We'll admit to medical floor Continue IV Zosyn and Levaquin Follow blood culture and sputum culture Oxygen as needed  2. Tobacco abuse in the setting of COPD Patient advised smoking cessation and about 4 minutes spent When necessary nebs  3. History of seizure disorder with possible seizure this morning No seizures noted here in the ER Seizure precautions Continue Keppra 500 twice a day  4. Depression and anxiety When necessary Valium continue Prozac  5. DVT prophylaxis subcutaneous Lovenox  All the records are reviewed and case discussed with ED provider. Management plans discussed with the patient, family and they are in agreement.  CODE STATUS:Full  TOTALCRITICAL  TIME TAKING CARE OF THIS PATIENT: 50  minutes.    Jacky Hartung M.D on 08/29/2015 at 3:27 PM  Between 7am to 6pm - Pager - [phone]  After 6pm go to [email or web address] - password [email or web address]  [facility]  Office  [phone]  CC: Primary care physician; PROVIDER NOT IN SYSTEM

## 2015-08-29 NOTE — ED Notes (Signed)
Family at bedside. 

## 2015-08-29 NOTE — ED Notes (Signed)
Admitting MD at bedside.

## 2015-08-29 NOTE — ED Notes (Signed)
Pt denies any neck or back pain. States vision to left eye is blurry. Eye lid of left eye is swollen, reddened, abrasions around lid and on forehead.

## 2015-08-29 NOTE — Plan of Care (Signed)
Problem: Safety: Goal: Ability to remain free from injury will improve Outcome: Progressing Pt has history of falls. Unsteady on his feet. Bed alarm on   Problem: Activity: Goal: Ability to tolerate increased activity will improve Outcome: Progressing Pt is dyspneic on exertion. PT ordered  Problem: Education: Goal: Knowledge of disease or condition will improve Outcome: Not Progressing Pt is noncompliant with medicines at home. Pt is a current smoker.

## 2015-08-30 LAB — PROTEIN, TOTAL: Total Protein: 6.6 g/dL (ref 6.5–8.1)

## 2015-08-30 MED ORDER — ENSURE ENLIVE PO LIQD
237.0000 mL | Freq: Three times a day (TID) | ORAL | Status: DC
  Administered 2015-08-31 – 2015-09-04 (×12): 237 mL via ORAL

## 2015-08-30 MED ORDER — ALBUTEROL SULFATE (2.5 MG/3ML) 0.083% IN NEBU
2.5000 mg | INHALATION_SOLUTION | Freq: Three times a day (TID) | RESPIRATORY_TRACT | Status: DC
  Administered 2015-08-31 – 2015-09-04 (×11): 2.5 mg via RESPIRATORY_TRACT
  Filled 2015-08-30 (×14): qty 3

## 2015-08-30 NOTE — Plan of Care (Signed)
Problem: Safety: Goal: Ability to remain free from injury will improve Outcome: Progressing Pt is a high fall risk, impulsive. Encouraged to call for assistance. Standby assist to the bathroom. No c/o pain at this time.

## 2015-08-30 NOTE — Progress Notes (Signed)
[facility] [place] [facility]                                                                                                                                                                                            Patient Demographics   George Smith, is a 59 y.o. male, DOB - 19-Nov-1955, ZOX:[id number]  Admit date - 08/29/2015   Admitting Physician Enedina FinnerSona Jolan Mealor, MD  Outpatient Primary MD for the patient is PROVIDER NOT IN SYSTEM   LOS - 1  Subjective: Patient admitted with sepsis and pneumonia, he reports that he was in [facility] with pneumonia and was in the hospital for about a month last month. He is still complaining of cough and congestion no chest pains     Review of Systems:   CONSTITUTIONAL: No documented fever. No fatigue, weakness. No weight gain, no weight loss.  EYES: No blurry or double vision.  ENT: No tinnitus. No postnasal drip. No redness of the oropharynx.  RESPIRATORY: Positive cough, no wheeze, no hemoptysis. Positive dyspnea.  CARDIOVASCULAR: No chest pain. No orthopnea. No palpitations. No syncope.  GASTROINTESTINAL: No nausea, no vomiting or diarrhea. No abdominal pain. No melena or hematochezia.  GENITOURINARY: No dysuria or hematuria.  ENDOCRINE: No polyuria or nocturia. No heat or cold intolerance.  HEMATOLOGY: No anemia. No bruising. No bleeding.  INTEGUMENTARY: No rashes. No lesions.  MUSCULOSKELETAL: No arthritis. No swelling. No gout.  NEUROLOGIC: No numbness, tingling, or ataxia. No seizure-type activity.  PSYCHIATRIC: No anxiety. No insomnia. No ADD.    Vitals:   Filed Vitals:   08/30/15 0800 08/30/15 0925 08/30/15 1007 08/30/15 1200  BP: 113/85   115/78  Pulse: 105 109  107  Temp: 97.5 F (36.4 C)   97.9 F (36.6 C)  TempSrc: Oral   Oral  Resp: 21   23  Height:      Weight:   80.468 kg (177 lb 6.4 oz)   SpO2: 96% 88%  94%    Wt Readings from Last 3 Encounters:  08/30/15 80.468 kg (177 lb 6.4 oz)   08/03/15 73.483 kg (162 lb)  07/19/15 64.864 kg (143 lb)     Intake/Output Summary (Last 24 hours) at 08/30/15 1416 Last data filed at 08/30/15 1100  Gross per 24 hour  Intake    580 ml  Output      0 ml  Net    580 ml    Physical Exam:   GENERAL: Pleasant-appearing in no apparent distress.  HEAD, EYES, EARS, NOSE AND THROAT: Atraumatic, normocephalic. Extraocular muscles are intact. Pupils equal and reactive to light. Sclerae  anicteric. No conjunctival injection. No oro-pharyngeal erythema.  NECK: Supple. There is no jugular venous distention. No bruits, no lymphadenopathy, no thyromegaly.  HEART: Regular rate and rhythm,. No murmurs, no rubs, no clicks.  LUNGS: Clear to auscultation bilaterally. No rales or rhonchi. No wheezes.  ABDOMEN: Soft, flat, nontender, nondistended. Has good bowel sounds. No hepatosplenomegaly appreciated.  EXTREMITIES: No evidence of any cyanosis, clubbing, or peripheral edema.  +2 pedal and radial pulses bilaterally.  NEUROLOGIC: The patient is alert, awake, and oriented x3 with no focal motor or sensory deficits appreciated bilaterally.  SKIN: Moist and warm with no rashes appreciated.  Psych: Not anxious, depressed LN: No inguinal LN enlargement    Antibiotics   Anti-infectives    Start     Dose/Rate Route Frequency Ordered Stop   08/30/15 1600  levofloxacin (LEVAQUIN) IVPB 750 mg     750 mg 100 mL/hr over 90 Minutes Intravenous Every 24 hours 08/29/15 1559     08/29/15 2200  piperacillin-tazobactam (ZOSYN) IVPB 3.375 g     3.375 g 12.5 mL/hr over 240 Minutes Intravenous 3 times per day 08/29/15 1602     08/29/15 1400  metroNIDAZOLE (FLAGYL) tablet 500 mg  Status:  Discontinued     500 mg Oral Every 12 hours 08/29/15 1356 08/29/15 1533   08/29/15 1345  vancomycin (VANCOCIN) IVPB 1000 mg/200 mL premix     1,000 mg 200 mL/hr over 60 Minutes Intravenous  Once 08/29/15 1343 08/29/15 1449   08/29/15 1345  piperacillin-tazobactam (ZOSYN) IVPB  3.375 g     3.375 g 12.5 mL/hr over 240 Minutes Intravenous  Once 08/29/15 1343 08/29/15 1856   08/29/15 1345  levofloxacin (LEVAQUIN) IVPB 750 mg     750 mg 100 mL/hr over 90 Minutes Intravenous  Once 08/29/15 1343 08/29/15 1841      Medications   Scheduled Meds: . albuterol  2.5 mg Nebulization Q6H  . enoxaparin (LOVENOX) injection  40 mg Subcutaneous Q24H  . feeding supplement (ENSURE ENLIVE)  237 mL Oral TID WC  . FLUoxetine  10 mg Oral Daily  . furosemide  40 mg Oral Daily  . levETIRAcetam  500 mg Oral BID  . levofloxacin (LEVAQUIN) IV  750 mg Intravenous Q24H  . mometasone-formoterol  2 puff Inhalation BID  . piperacillin-tazobactam (ZOSYN)  IV  3.375 g Intravenous 3 times per day  . pneumococcal 23 valent vaccine  0.5 mL Intramuscular Tomorrow-1000   Continuous Infusions:  PRN Meds:.acetaminophen **OR** acetaminophen, HYDROcodone-acetaminophen, ipratropium-albuterol, ondansetron **OR** ondansetron (ZOFRAN) IV, senna-docusate   Data Review:   Micro Results No results found for this or any previous visit (from the past 240 hour(s)).  Radiology Reports Dg Eye Foreign Body  08/10/2015  CLINICAL DATA:  Metal working/exposure; clearance prior to MRI EXAM: ORBITS FOR FOREIGN BODY - 2 VIEW COMPARISON:  None. FINDINGS: There is no evidence of metallic foreign body within the orbits. No significant bone abnormality identified. IMPRESSION: No evidence of metallic foreign body within the orbits. Electronically Signed   By: Lupita Raider, M.D.   On: 08/10/2015 10:01   Dg Chest 2 View  08/29/2015  CLINICAL DATA:  Severe shortness of breath beginning approximately 2 weeks ago, worsening. Initial encounter. EXAM: CHEST  2 VIEW COMPARISON:  None. FINDINGS: The patient has a moderate to moderately large right pleural effusion with extensive airspace disease in the right middle and lower lobes. The left lung is clear. Heart size is scratch the cardiac silhouette is partially is obscured.  No pneumothorax  identified. IMPRESSION: Right pleural effusion noted extensive airspace disease likely due to pneumonia. Recommend followup to clearing. Electronically Signed   By: Drusilla Kanner M.D.   On: 08/29/2015 13:04   Ct Head Wo Contrast  08/29/2015  CLINICAL DATA:  Status post fall this morning. Hematoma left side of the forehead. Blurry vision left eye. Swelling and redness of the left eye lid. Initial encounter. EXAM: CT HEAD WITHOUT CONTRAST AND ORBITS WITH CONTRAST TECHNIQUE: Contiguous axial images were obtained from the base of the skull through the vertex without contrast. Multidetector CT imaging of the orbits was performed using the standard protocol with and without intravenous contrast. CONTRAST:  75 mL OMNIPAQUE IOHEXOL 300 MG/ML  SOLN COMPARISON:  None. FINDINGS: CT HEAD FINDINGS There is no evidence of acute intracranial abnormality including hemorrhage, infarct, mass lesion, mass effect, midline shift or abnormal extra-axial fluid collection. No hydrocephalus or pneumocephalus. The calvarium is intact. CT ORBITS FINDINGS The globes are intact and the lenses are located bilaterally. Orbital fat is clear. No soft tissue gas collection or radiopaque foreign body is identified. Extraocular muscles and optic nerves are unremarkable in appearance. Reported hematoma on the left side of the forehead is difficult to visualize on this exam. Mild ethmoid air cell disease is seen. Imaged cranial sinuses are otherwise clear. The mastoid air cells are clear. Major caliber vascular structures demonstrate atherosclerotic vascular disease are otherwise unremarkable. No lymphadenopathy is identified. No facial bone fracture is identified. The mandibular condyles are located. Multiple cavities are partially visualized and periapical lucencies are seen about multiple upper teeth. IMPRESSION: No acute abnormality head or orbits. Hematoma the left side of the forehead is not seen on these exams. Extensive  dental disease. Electronically Signed   By: Drusilla Kanner M.D.   On: 08/29/2015 12:42   Ct Chest Wo Contrast  08/29/2015  CLINICAL DATA:  Right pleural effusion EXAM: CT CHEST WITHOUT CONTRAST TECHNIQUE: Multidetector CT imaging of the chest was performed following the standard protocol without IV contrast. COMPARISON:  Chest x-ray earlier today FINDINGS: There is a moderate right pleural effusion, partially loculated laterally. Small left pleural effusion which appears free flowing. Right middle and lower lobe airspace disease could reflect atelectasis or infiltrate. Left lung is clear. Heart is mildly enlarged. Scattered coronary artery calcifications and aortic calcifications. No aneurysm. Mildly enlarged mediastinal lymph nodes. Index right peritracheal node has a short axis diameter of 11 mm on image 21. Numerous right paratracheal, pre tracheal, sub carina and prevascular lymph nodes. No axillary or visible hilar adenopathy. Imaging into the upper abdomen shows mild perihepatic and perisplenic ascites. No acute bony abnormality or focal bone lesion. IMPRESSION: Moderate right pleural effusion, partially loculated laterally. Small free-flowing left pleural effusion. Right middle and lower lobe atelectasis or consolidation/ pneumonia. Cardiomegaly, coronary artery disease. Mild mediastinal adenopathy. This could be reactive. This could be followed with repeat CT in 3-6 months. Electronically Signed   By: Charlett Nose M.D.   On: 08/29/2015 14:43   Ct Orbits W/cm  08/29/2015  CLINICAL DATA:  Status post fall this morning. Hematoma left side of the forehead. Blurry vision left eye. Swelling and redness of the left eye lid. Initial encounter. EXAM: CT HEAD WITHOUT CONTRAST AND ORBITS WITH CONTRAST TECHNIQUE: Contiguous axial images were obtained from the base of the skull through the vertex without contrast. Multidetector CT imaging of the orbits was performed using the standard protocol with and without  intravenous contrast. CONTRAST:  75 mL OMNIPAQUE IOHEXOL 300 MG/ML  SOLN COMPARISON:  None. FINDINGS: CT HEAD FINDINGS There is no evidence of acute intracranial abnormality including hemorrhage, infarct, mass lesion, mass effect, midline shift or abnormal extra-axial fluid collection. No hydrocephalus or pneumocephalus. The calvarium is intact. CT ORBITS FINDINGS The globes are intact and the lenses are located bilaterally. Orbital fat is clear. No soft tissue gas collection or radiopaque foreign body is identified. Extraocular muscles and optic nerves are unremarkable in appearance. Reported hematoma on the left side of the forehead is difficult to visualize on this exam. Mild ethmoid air cell disease is seen. Imaged cranial sinuses are otherwise clear. The mastoid air cells are clear. Major caliber vascular structures demonstrate atherosclerotic vascular disease are otherwise unremarkable. No lymphadenopathy is identified. No facial bone fracture is identified. The mandibular condyles are located. Multiple cavities are partially visualized and periapical lucencies are seen about multiple upper teeth. IMPRESSION: No acute abnormality head or orbits. Hematoma the left side of the forehead is not seen on these exams. Extensive dental disease. Electronically Signed   By: Drusilla Kanner M.D.   On: 08/29/2015 12:42     CBC  Recent Labs Lab 08/29/15 0936  WBC 12.2*  HGB 13.8  HCT 42.7  PLT 224  MCV 86.7  MCH 28.0  MCHC 32.3  RDW 15.4*  LYMPHSABS 0.8*  MONOABS 0.7  EOSABS 0.0  BASOSABS 0.0    Chemistries   Recent Labs Lab 08/29/15 0936  NA 139  K 4.8  CL 105  CO2 24  GLUCOSE 122*  BUN 22*  CREATININE 0.99  CALCIUM 8.7*  AST 38  ALT 13*  ALKPHOS 77  BILITOT 1.5*   ------------------------------------------------------------------------------------------------------------------ estimated creatinine clearance is 77.6 mL/min (by C-G formula based on Cr of  0.99). ------------------------------------------------------------------------------------------------------------------ No results for input(s): HGBA1C in the last 72 hours. ------------------------------------------------------------------------------------------------------------------ No results for input(s): CHOL, HDL, LDLCALC, TRIG, CHOLHDL, LDLDIRECT in the last 72 hours. ------------------------------------------------------------------------------------------------------------------ No results for input(s): TSH, T4TOTAL, T3FREE, THYROIDAB in the last 72 hours.  Invalid input(s): FREET3 ------------------------------------------------------------------------------------------------------------------ No results for input(s): VITAMINB12, FOLATE, FERRITIN, TIBC, IRON, RETICCTPCT in the last 72 hours.  Coagulation profile  Recent Labs Lab 08/29/15 0936  INR 1.24    No results for input(s): DDIMER in the last 72 hours.  Cardiac Enzymes No results for input(s): CKMB, TROPONINI, MYOGLOBIN in the last 168 hours.  Invalid input(s): CK ------------------------------------------------------------------------------------------------------------------ Invalid input(s): POCBNP    Assessment & Plan   George Smith is a 59 y.o. male with a known history of COPD with ongoing tobacco abuse, history of seizure disorder, depression and anxiety comes to the emergency room with generalized weakness cough with whitish yellowish phlegm and shortness of breath for more than 2 weeks.  1. Sepsis secondary to right middle lobe lower lobe pneumonia. Continue IV vancomycin and Levaquin Right sided pleural effusion with patient having recurrent pneumonia we'll have radiology's try to drain this    2. Tobacco abuse in the setting of COPD Patient advised smoking cessation When necessary nebs  3. History of seizure disorder with possible seizure this morning No seizures noted here in the  ER Seizure precautions Continue Keppra 500 twice a day  4. Depression and anxiety When necessary Valium continue Prozac  5. DVT prophylaxis subcutaneous Lovenox       Code Status Orders        Start     Ordered   08/29/15 1549  Full code   Continuous     08/29/15 1548  Consults none  DVT Prophylaxis  Heparin    Lab Results  Component Value Date   PLT 224 08/29/2015     Time Spent in minutes   35 minutes Auburn Bilberry M.D on 08/30/2015 at 2:16 PM  Between 7am to 6pm - Pager - [phone]  After 6pm go to [email or web address] - password [email or web address] [facility]  [facility]   Office  [phone]

## 2015-08-30 NOTE — Progress Notes (Signed)
Initial Nutrition Assessment   INTERVENTION:   Meals and Snacks: Cater to patient preferences Medical Food Supplement Therapy: will recommend Ensure Enlive po BID, each supplement provides 350 kcal and 20 grams of protein Coordination of Care: recommend daily weights   NUTRITION DIAGNOSIS:   Inadequate oral intake related to chronic illness as evidenced by per patient/family report.  GOAL:   Patient will meet greater than or equal to 90% of their needs  MONITOR:    (Energy Intake, Digestive system, anthropometrics)  REASON FOR ASSESSMENT:   Malnutrition Screening Tool    ASSESSMENT:   Pt admitted with sepsis secondary to pna and possible seizure. Pt with h/o COPD and current tobacco use per MD note.  Past Medical History  Diagnosis Date  . COPD (chronic obstructive pulmonary disease) (HCC)   . 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 Lung disease     spots on lungs that have not been checked on  . Seizures (HCC) 05/2015  . Heart disease   . Headache     migraines  . Depression   . Anxiety     Diet Order:  DIET SOFT Room service appropriate?: Yes; Fluid consistency:: Thin    Current Nutrition: Pt ate 60% of pancakes this am with 100% of muffin and 90% of coffee. Pt reports appetite is doing much better this am.   Food/Nutrition-Related History: Pt reports poor po intake for the past month eating <50% of usual intake. Pt reports no appetite and difficult to keep down what he has eaten. Pt reports drinking Ensure last admission to hospital.   Scheduled Medications:  . albuterol  2.5 mg Nebulization Q6H  . enoxaparin (LOVENOX) injection  40 mg Subcutaneous Q24H  . feeding supplement (ENSURE ENLIVE)  237 mL Oral TID WC  . FLUoxetine  10 mg Oral Daily  . furosemide  40 mg Oral Daily  . levETIRAcetam  500 mg Oral BID  . levofloxacin (LEVAQUIN) IV  750 mg Intravenous Q24H  . mometasone-formoterol  2 puff Inhalation BID  . piperacillin-tazobactam  (ZOSYN)  IV  3.375 g Intravenous 3 times per day  . pneumococcal 23 valent vaccine  0.5 mL Intramuscular Tomorrow-1000    Electrolyte/Renal Profile and Glucose Profile:   Recent Labs Lab 08/29/15 0936  NA 139  K 4.8  CL 105  CO2 24  BUN 22*  CREATININE 0.99  CALCIUM 8.7*  GLUCOSE 122*   Protein Profile:  Recent Labs Lab 08/29/15 0936  ALBUMIN 3.6    Gastrointestinal Profile: abdomen distended Last BM:  08/29/2015   Nutrition-Focused Physical Exam Findings: Nutrition-Focused physical exam completed. Findings are WDL for fat depletion, muscle depletion, and edema.    Weight Change: Pt reports current weight of 150lbs. Weight this am of 127lbs in [facility], RN re-weighed pt after zeroing bed, weight recorded of 177lbs. Pt reports weight of 190lbs in May with hospitalization. Per [facility] weight of 147lbs 9/13. 7% weight loss potentially from May (6 months ago).    Height:   Ht Readings from Last 1 Encounters:  08/29/15  (1.651 m)    Weight:   Wt Readings from Last 1 Encounters:  08/30/15 177 lb 6.4 oz (80.468 kg)    Wt Readings from Last 10 Encounters:  08/30/15 177 lb 6.4 oz (80.468 kg)  08/03/15 162 lb (73.483 kg)  07/19/15 143 lb (64.864 kg)  07/05/15 147 lb 14.9 oz (67.1 kg)     BMI:  Body mass index is 29.52  kg/(m^2).  Estimated Nutritional Needs:   Kcal:  BEE: 1312kcals, TEE: (IF 1.1-1.3)(AF 1.3) 1875-2217kcals  Protein:  46-58g protein (0.8-1.0g/kg)  Fluid:  1450-175840mL of fluid (25-6630mL/kg)  EDUCATION NEEDS:   No education needs identified at this time   MODERATE Care Level  Leda QuailAllyson Madalena Kesecker, RD, LDN Pager [phone]

## 2015-08-30 NOTE — Evaluation (Signed)
Physical Therapy Evaluation Patient Details Name: George Smith MRN: [id number] DOB: [birth year] Today's Date: 08/30/2015   History of Present Illness  Pt is a 59 y.o. male presenting to hospital with general weakness, cough, whitish yellowish phlegm, and SOB >2 weeks.  Pt admitted with acute on chronic respiratory failure secondary to R middle lower lobe PNA.  PMH includes COPD, seizures disorder, depression, anxiety, home O2  Clinical Impression  Currently pt demonstrates impairments with general strength, balance, and limitations with functional mobility.  Prior to admission, pt was independent without AD.  Pt lives with his wife, 3 children, and 2 grandchildren in multi-story home with stairs to navigate outside of home and within home.  Currently pt is CGA with transfers and ambulation with RW (pt unsteady without AD); distance limited d/t pt not wanting to walk any further (pt's O2 sats also noted to be 88% on room air end of ambulation and required a few minutes sitting rest to return back to 92% on room air).  Pt would benefit from skilled PT to address above noted impairments and functional limitations.  Recommend pt discharge to home with HHPT (pt does not appear receptive to HHPT though) when medically appropriate.     Follow Up Recommendations Home health PT;Supervision/Assistance - 24 hour    Equipment Recommendations  Rolling walker with 5" wheels    Recommendations for Other Services       Precautions / Restrictions Precautions Precautions: Fall Restrictions Weight Bearing Restrictions: No      Mobility  Bed Mobility Overal bed mobility: Modified Independent             General bed mobility comments: Supine to/from sit with HOB elevated  Transfers Overall transfer level: Needs assistance Equipment used: Rolling walker (2 wheeled) Transfers: Sit to/from Stand Sit to Stand: Min guard         General transfer comment: steady without loss of  balance  Ambulation/Gait Ambulation/Gait assistance: Min guard Ambulation Distance (Feet): 50 Feet Assistive device: Rolling walker (2 wheeled) Gait Pattern/deviations: Step-through pattern Gait velocity: mildly decreased   General Gait Details: pt unsteady without AD but balance improved with use of RW; no loss of balance with use of RW  Stairs            Wheelchair Mobility    Modified Rankin (Stroke Patients Only)       Balance Overall balance assessment: Needs assistance Sitting-balance support: No upper extremity supported;Feet supported Sitting balance-Leahy Scale: Normal     Standing balance support: Bilateral upper extremity supported (on RW) Standing balance-Leahy Scale: Good                               Pertinent Vitals/Pain Pain Assessment: No/denies pain    Home Living Family/patient expects to be discharged to:: Private residence Living Arrangements: Spouse/significant other;Children (Wife, 3 children (ages 65, 60, 59) and 2 grandchildren (ages 23 & 4)) Available Help at Discharge: Family Type of Home: House Home Access: Stairs to enter Entrance Stairs-Rails: None Secretary/administrator of Steps: 4 Home Layout: Multi-level (bedroom main floor; bathroom on 2nd floor)        Prior Function Level of Independence: Independent         Comments: Pt denies any use of AD and reports not remembering any recent falls (although acknowledging bruise on L side of head and that people have told him he must have fallen; pt attributes falls to seizure disorder)  Hand Dominance        Extremity/Trunk Assessment   Upper Extremity Assessment: Generalized weakness           Lower Extremity Assessment: Generalized weakness         Communication   Communication: No difficulties  Cognition Arousal/Alertness: Awake/alert Behavior During Therapy: Agitated;Impulsive (Pt stating "now you're annoying me" a few times during  session) Overall Cognitive Status: No family/caregiver present to determine baseline cognitive functioning (Oriented to person, place, time, and situation)                      General Comments   Nursing cleared pt for participation in physical therapy.  Pt agreeable to PT session but at times appearing to get agitated.  Pt reports using O2 at night and also first thing in morning; past 3 weeks pt will use O2 as needed during the day.     Exercises        Assessment/Plan    PT Assessment Patient needs continued PT services  PT Diagnosis Difficulty walking;Generalized weakness   PT Problem List Decreased strength;Decreased activity tolerance;Decreased balance;Decreased mobility;Cardiopulmonary status limiting activity  PT Treatment Interventions DME instruction;Gait training;Stair training;Functional mobility training;Therapeutic activities;Therapeutic exercise;Balance training;Neuromuscular re-education;Patient/family education   PT Goals (Current goals can be found in the Care Plan section) Acute Rehab PT Goals Patient Stated Goal: to go home PT Goal Formulation: With patient Time For Goal Achievement: 09/13/15 Potential to Achieve Goals: Good    Frequency Min 2X/week   Barriers to discharge        Co-evaluation               End of Session Equipment Utilized During Treatment: Gait belt Activity Tolerance: Patient tolerated treatment well (Distance limited for ambulation due to pt not wanting to walk any further) Patient left: in bed;with call bell/phone within reach;with bed alarm set Nurse Communication: Mobility status (O2 desaturation with ambulation)         Time: 2440-10270905-0930 PT Time Calculation (min) (ACUTE ONLY): 25 min   Charges:   PT Evaluation $Initial PT Evaluation Tier I: 1 Procedure     PT G CodesHendricks Limes:        Caretha Rumbaugh 08/30/2015, 11:16 AM Hendricks LimesEmily Deb Loudin, PT [phone]

## 2015-08-30 NOTE — Plan of Care (Signed)
Problem: Safety: Goal: Ability to remain free from injury will improve Outcome: Progressing High Fall Risk. Patient is impulsive - education provided. Encouragement given to call for assistance. No complaints of pain.

## 2015-08-31 ENCOUNTER — Inpatient Hospital Stay: Payer: Medicaid Other

## 2015-08-31 LAB — BODY FLUID CELL COUNT WITH DIFFERENTIAL
Eos, Fluid: 0 %
LYMPHS FL: 42 %
MONOCYTE-MACROPHAGE-SEROUS FLUID: 22 %
Neutrophil Count, Fluid: 26 %
Other Cells, Fluid: 10 %
WBC FLUID: 21 uL

## 2015-08-31 LAB — IGG, IGA, IGM
IGA: 322 mg/dL (ref 90–386)
IGG (IMMUNOGLOBIN G), SERUM: 1142 mg/dL (ref 700–1600)
IGM, SERUM: 34 mg/dL (ref 20–172)

## 2015-08-31 LAB — HIV ANTIBODY (ROUTINE TESTING W REFLEX): HIV Screen 4th Generation wRfx: NONREACTIVE

## 2015-08-31 LAB — LACTATE DEHYDROGENASE, PLEURAL OR PERITONEAL FLUID: LD, Fluid: 62 U/L — ABNORMAL HIGH (ref 3–23)

## 2015-08-31 LAB — PROTEIN, BODY FLUID

## 2015-08-31 LAB — GLUCOSE, SEROUS FLUID: Glucose, Fluid: 101 mg/dL

## 2015-08-31 LAB — LACTATE DEHYDROGENASE: LDH: 184 U/L (ref 98–192)

## 2015-08-31 NOTE — Care Management (Signed)
Admitted to this facility with the diagnosis of pneumonia. Lives with wife, Selena BattenKim [phone]) and 4 other family members. Moved here from [place] 6 months ago because the doctors said "it would be better for me."  Last seen Dr. Glenis SmokerNeimeyer in May.  Just started the disability application. Retired from working in May. "Worked in [employer] all my life." No home health. No skilled facility. Uses no aids for ambulation. Takes care of all basic activities of daily living himself, drives. Fell at home prior to this admission. Good appetite. Brother will transport. Gwenette GreetBrenda S Kateland Leisinger RN MSN CCM Care Management [phone]

## 2015-08-31 NOTE — Progress Notes (Signed)
[facility] [place] at [facility]                                                                                                                                                                                            Patient Demographics   George Smith, is a 59 y.o. male, DOB - [birth year], ZOX:[id number]  Admit date - 08/29/2015   Admitting Physician Enedina FinnerSona Laurel Smeltz, MD  Outpatient Primary MD for the patient is PROVIDER NOT IN SYSTEM   LOS - 2  Subjective: Still complains of cough and some shortness of breath. He is waiting for thoracentesis   Review of Systems:   CONSTITUTIONAL: No documented fever. No fatigue, weakness. No weight gain, no weight loss.  EYES: No blurry or double vision.  ENT: No tinnitus. No postnasal drip. No redness of the oropharynx.  RESPIRATORY: Positive cough, no wheeze, no hemoptysis. Positive dyspnea.  CARDIOVASCULAR: No chest pain. No orthopnea. No palpitations. No syncope.  GASTROINTESTINAL: No nausea, no vomiting or diarrhea. No abdominal pain. No melena or hematochezia.  GENITOURINARY: No dysuria or hematuria.  ENDOCRINE: No polyuria or nocturia. No heat or cold intolerance.  HEMATOLOGY: No anemia. No bruising. No bleeding.  INTEGUMENTARY: No rashes. No lesions.  MUSCULOSKELETAL: No arthritis. No swelling. No gout.  NEUROLOGIC: No numbness, tingling, or ataxia. No seizure-type activity.  PSYCHIATRIC: No anxiety. No insomnia. No ADD.    Vitals:   Filed Vitals:   08/31/15 1032 08/31/15 1302 08/31/15 1340 08/31/15 1407  BP:  136/83 135/100 118/82  Pulse: 115 109 113 104  Temp:  98.4 F (36.9 C)    TempSrc:  Oral    Resp:  18 18 20   Height:      Weight:      SpO2: 86% 93% 92% 88%    Wt Readings from Last 3 Encounters:  08/30/15 80.468 kg (177 lb 6.4 oz)  08/03/15 73.483 kg (162 lb)  07/19/15 64.864 kg (143 lb)     Intake/Output Summary (Last 24 hours) at 08/31/15 1422 Last data filed at 08/30/15 1900  Gross per  24 hour  Intake    530 ml  Output      0 ml  Net    530 ml    Physical Exam:   GENERAL: Pleasant-appearing in no apparent distress.  HEAD, EYES, EARS, NOSE AND THROAT: Atraumatic, normocephalic. Extraocular muscles are intact. Pupils equal and reactive to light. Sclerae anicteric. No conjunctival injection. No oro-pharyngeal erythema.  NECK: Supple. There is no jugular venous distention. No bruits, no lymphadenopathy, no thyromegaly.  HEART: Regular rate and rhythm,. No murmurs, no rubs, no clicks.  LUNGS: Diminished breath sounds at the right lung base there is no rhonchi or wheezing ABDOMEN: Soft, flat, nontender, nondistended. Has good bowel sounds. No hepatosplenomegaly appreciated.  EXTREMITIES: No evidence of any cyanosis, clubbing, or peripheral edema.  +2 pedal and radial pulses bilaterally.  NEUROLOGIC: The patient is alert, awake, and oriented x3 with no focal motor or sensory deficits appreciated bilaterally.  SKIN: Moist and warm with no rashes appreciated.  Psych: Not anxious, depressed LN: No inguinal LN enlargement    Antibiotics   Anti-infectives    Start     Dose/Rate Route Frequency Ordered Stop   08/30/15 1600  levofloxacin (LEVAQUIN) IVPB 750 mg     750 mg 100 mL/hr over 90 Minutes Intravenous Every 24 hours 08/29/15 1559     08/29/15 2200  piperacillin-tazobactam (ZOSYN) IVPB 3.375 g     3.375 g 12.5 mL/hr over 240 Minutes Intravenous 3 times per day 08/29/15 1602     08/29/15 1400  metroNIDAZOLE (FLAGYL) tablet 500 mg  Status:  Discontinued     500 mg Oral Every 12 hours 08/29/15 1356 08/29/15 1533   08/29/15 1345  vancomycin (VANCOCIN) IVPB 1000 mg/200 mL premix     1,000 mg 200 mL/hr over 60 Minutes Intravenous  Once 08/29/15 1343 08/29/15 1449   08/29/15 1345  piperacillin-tazobactam (ZOSYN) IVPB 3.375 g     3.375 g 12.5 mL/hr over 240 Minutes Intravenous  Once 08/29/15 1343 08/29/15 1856   08/29/15 1345  levofloxacin (LEVAQUIN) IVPB 750 mg     750  mg 100 mL/hr over 90 Minutes Intravenous  Once 08/29/15 1343 08/29/15 1841      Medications   Scheduled Meds: . albuterol  2.5 mg Nebulization TID  . enoxaparin (LOVENOX) injection  40 mg Subcutaneous Q24H  . feeding supplement (ENSURE ENLIVE)  237 mL Oral TID WC  . FLUoxetine  10 mg Oral Daily  . furosemide  40 mg Oral Daily  . levETIRAcetam  500 mg Oral BID  . levofloxacin (LEVAQUIN) IV  750 mg Intravenous Q24H  . mometasone-formoterol  2 puff Inhalation BID  . piperacillin-tazobactam (ZOSYN)  IV  3.375 g Intravenous 3 times per day  . pneumococcal 23 valent vaccine  0.5 mL Intramuscular Tomorrow-1000   Continuous Infusions:  PRN Meds:.acetaminophen **OR** acetaminophen, HYDROcodone-acetaminophen, ipratropium-albuterol, ondansetron **OR** ondansetron (ZOFRAN) IV, senna-docusate   Data Review:   Micro Results No results found for this or any previous visit (from the past 240 hour(s)).  Radiology Reports Dg Eye Foreign Body  08/10/2015  CLINICAL DATA:  Metal working/exposure; clearance prior to MRI EXAM: ORBITS FOR FOREIGN BODY - 2 VIEW COMPARISON:  None. FINDINGS: There is no evidence of metallic foreign body within the orbits. No significant bone abnormality identified. IMPRESSION: No evidence of metallic foreign body within the orbits. Electronically Signed   By: Lupita Raider, M.D.   On: 08/10/2015 10:01   Dg Chest 2 View  08/29/2015  CLINICAL DATA:  Severe shortness of breath beginning approximately 2 weeks ago, worsening. Initial encounter. EXAM: CHEST  2 VIEW COMPARISON:  None. FINDINGS: The patient has a moderate to moderately large right pleural effusion with extensive airspace disease in the right middle and lower lobes. The left lung is clear. Heart size is scratch the cardiac silhouette is partially is obscured. No pneumothorax identified. IMPRESSION: Right pleural effusion noted extensive airspace disease likely due to pneumonia. Recommend followup to clearing.  Electronically Signed   By: Drusilla Kanner M.D.   On: 08/29/2015 13:04  Ct Head Wo Contrast  08/29/2015  CLINICAL DATA:  Status post fall this morning. Hematoma left side of the forehead. Blurry vision left eye. Swelling and redness of the left eye lid. Initial encounter. EXAM: CT HEAD WITHOUT CONTRAST AND ORBITS WITH CONTRAST TECHNIQUE: Contiguous axial images were obtained from the base of the skull through the vertex without contrast. Multidetector CT imaging of the orbits was performed using the standard protocol with and without intravenous contrast. CONTRAST:  75 mL OMNIPAQUE IOHEXOL 300 MG/ML  SOLN COMPARISON:  None. FINDINGS: CT HEAD FINDINGS There is no evidence of acute intracranial abnormality including hemorrhage, infarct, mass lesion, mass effect, midline shift or abnormal extra-axial fluid collection. No hydrocephalus or pneumocephalus. The calvarium is intact. CT ORBITS FINDINGS The globes are intact and the lenses are located bilaterally. Orbital fat is clear. No soft tissue gas collection or radiopaque foreign body is identified. Extraocular muscles and optic nerves are unremarkable in appearance. Reported hematoma on the left side of the forehead is difficult to visualize on this exam. Mild ethmoid air cell disease is seen. Imaged cranial sinuses are otherwise clear. The mastoid air cells are clear. Major caliber vascular structures demonstrate atherosclerotic vascular disease are otherwise unremarkable. No lymphadenopathy is identified. No facial bone fracture is identified. The mandibular condyles are located. Multiple cavities are partially visualized and periapical lucencies are seen about multiple upper teeth. IMPRESSION: No acute abnormality head or orbits. Hematoma the left side of the forehead is not seen on these exams. Extensive dental disease. Electronically Signed   By: Drusilla Kanner M.D.   On: 08/29/2015 12:42   Ct Chest Wo Contrast  08/29/2015  CLINICAL DATA:  Right  pleural effusion EXAM: CT CHEST WITHOUT CONTRAST TECHNIQUE: Multidetector CT imaging of the chest was performed following the standard protocol without IV contrast. COMPARISON:  Chest x-ray earlier today FINDINGS: There is a moderate right pleural effusion, partially loculated laterally. Small left pleural effusion which appears free flowing. Right middle and lower lobe airspace disease could reflect atelectasis or infiltrate. Left lung is clear. Heart is mildly enlarged. Scattered coronary artery calcifications and aortic calcifications. No aneurysm. Mildly enlarged mediastinal lymph nodes. Index right peritracheal node has a short axis diameter of 11 mm on image 21. Numerous right paratracheal, pre tracheal, sub carina and prevascular lymph nodes. No axillary or visible hilar adenopathy. Imaging into the upper abdomen shows mild perihepatic and perisplenic ascites. No acute bony abnormality or focal bone lesion. IMPRESSION: Moderate right pleural effusion, partially loculated laterally. Small free-flowing left pleural effusion. Right middle and lower lobe atelectasis or consolidation/ pneumonia. Cardiomegaly, coronary artery disease. Mild mediastinal adenopathy. This could be reactive. This could be followed with repeat CT in 3-6 months. Electronically Signed   By: Charlett Nose M.D.   On: 08/29/2015 14:43   Ct Orbits W/cm  08/29/2015  CLINICAL DATA:  Status post fall this morning. Hematoma left side of the forehead. Blurry vision left eye. Swelling and redness of the left eye lid. Initial encounter. EXAM: CT HEAD WITHOUT CONTRAST AND ORBITS WITH CONTRAST TECHNIQUE: Contiguous axial images were obtained from the base of the skull through the vertex without contrast. Multidetector CT imaging of the orbits was performed using the standard protocol with and without intravenous contrast. CONTRAST:  75 mL OMNIPAQUE IOHEXOL 300 MG/ML  SOLN COMPARISON:  None. FINDINGS: CT HEAD FINDINGS There is no evidence of acute  intracranial abnormality including hemorrhage, infarct, mass lesion, mass effect, midline shift or abnormal extra-axial fluid collection. No hydrocephalus or  pneumocephalus. The calvarium is intact. CT ORBITS FINDINGS The globes are intact and the lenses are located bilaterally. Orbital fat is clear. No soft tissue gas collection or radiopaque foreign body is identified. Extraocular muscles and optic nerves are unremarkable in appearance. Reported hematoma on the left side of the forehead is difficult to visualize on this exam. Mild ethmoid air cell disease is seen. Imaged cranial sinuses are otherwise clear. The mastoid air cells are clear. Major caliber vascular structures demonstrate atherosclerotic vascular disease are otherwise unremarkable. No lymphadenopathy is identified. No facial bone fracture is identified. The mandibular condyles are located. Multiple cavities are partially visualized and periapical lucencies are seen about multiple upper teeth. IMPRESSION: No acute abnormality head or orbits. Hematoma the left side of the forehead is not seen on these exams. Extensive dental disease. Electronically Signed   By: Drusilla Kanner M.D.   On: 08/29/2015 12:42     CBC  Recent Labs Lab 08/29/15 0936  WBC 12.2*  HGB 13.8  HCT 42.7  PLT 224  MCV 86.7  MCH 28.0  MCHC 32.3  RDW 15.4*  LYMPHSABS 0.8*  MONOABS 0.7  EOSABS 0.0  BASOSABS 0.0    Chemistries   Recent Labs Lab 08/29/15 0936  NA 139  K 4.8  CL 105  CO2 24  GLUCOSE 122*  BUN 22*  CREATININE 0.99  CALCIUM 8.7*  AST 38  ALT 13*  ALKPHOS 77  BILITOT 1.5*   ------------------------------------------------------------------------------------------------------------------ estimated creatinine clearance is 77.6 mL/min (by C-G formula based on Cr of 0.99). ------------------------------------------------------------------------------------------------------------------ No results for input(s): HGBA1C in the last 72  hours. ------------------------------------------------------------------------------------------------------------------ No results for input(s): CHOL, HDL, LDLCALC, TRIG, CHOLHDL, LDLDIRECT in the last 72 hours. ------------------------------------------------------------------------------------------------------------------ No results for input(s): TSH, T4TOTAL, T3FREE, THYROIDAB in the last 72 hours.  Invalid input(s): FREET3 ------------------------------------------------------------------------------------------------------------------ No results for input(s): VITAMINB12, FOLATE, FERRITIN, TIBC, IRON, RETICCTPCT in the last 72 hours.  Coagulation profile  Recent Labs Lab 08/29/15 0936  INR 1.24    No results for input(s): DDIMER in the last 72 hours.  Cardiac Enzymes No results for input(s): CKMB, TROPONINI, MYOGLOBIN in the last 168 hours.  Invalid input(s): CK ------------------------------------------------------------------------------------------------------------------ Invalid input(s): POCBNP    Assessment & Plan   George Smith is a 59 y.o. male with a known history of COPD with ongoing tobacco abuse, history of seizure disorder, depression and anxiety comes to the emergency room with generalized weakness cough with whitish yellowish phlegm and shortness of breath for more than 2 weeks.  1. Sepsis secondary to right middle lobe lower lobe pneumonia associated with large pleural effusion Continue IV vancomycin and Levaquin Right sided pleural effusion status post thoracentesis awaited results  2. Tobacco abuse in the setting of COPD Patient advised smoking cessation When necessary nebs  3. History of seizure disorder with possible seizure this morning No seizures noted here in the ER Seizure precautions Continue Keppra 500 twice a day  4. Depression and anxiety When necessary Valium continue Prozac  5. DVT prophylaxis subcutaneous Lovenox        Code Status Orders        Start     Ordered   08/29/15 1549  Full code   Continuous     08/29/15 1548           Consults none  DVT Prophylaxis  Heparin    Lab Results  Component Value Date   PLT 224 08/29/2015     Time Spent in minutes   35 minutes Ikeem Cleckler,  [facility] M.D on 08/31/2015 at 2:22 PM  Between 7am to 6pm - Pager - [phone]  After 6pm go to [email or web address] - password [email or web address] [facility]  [facility]   Office  [phone]

## 2015-08-31 NOTE — Clinical Social Work Note (Signed)
Clinical Social Work Assessment  Patient Details  Name: George Smith W Wilms MRN: [id number] Date of Birth: 1956-06-25  Date of referral:  08/31/15               Reason for consult:  Emotional/Coping/Adjustment to Illness, Other (Comment Required) (Safety concerns)                Permission sought to share information with:  Other Permission granted to share information::  No   Housing/Transportation Living arrangements for the past 2 months:  Single Family Home Source of Information:  Patient Patient Interpreter Needed:  None Criminal Activity/Legal Involvement Pertinent to Current Situation/Hospitalization:  No - Comment as needed Significant Relationships:  Adult Children, Spouse Lives with:  Adult Children, Spouse Do you feel safe going back to the place where you live?  Yes Need for family participation in patient care:  No (Coment)  Care giving concerns:  Pt reports that she is unable to afford medication and needs assistance taking them    Social Worker assessment / plan:  CSW receieved consult for safety and care giving concerns at home. Pt lives with wife and adult children. Pt moved to [place] 6 months ago from [place]. Pt initiatially did not have insurance and no he has Medicaid. Pt reported that he does not have a PCP. Pt stated that he needs assistance taking his medication and has a pill box. Pt is unable to afford his medication. Pt reports that he is supposed to take an antidepressant, however it was stopped and not started again. Pt connects being sick with his mood.   CSW will speak with RNCM reguarding additional services for pt (such as PCP, assistance with copays) during progression.   CSW will continue to follow.   Employment status:  Disabled (Comment on whether or not currently receiving Disability) (Applying for disability) Insurance information:  [place] Recommend[place]:  24 Hour Supervision Information / Referral to community resources:  Other (Comment  Required)  Patient/Family's Response to care:  Pt was appreciative of CSW support.   Patient/Family's Understanding of and Emotional Response to Diagnosis, Current Treatment, and Prognosis:  Pt understands that he needs to take his medication to remain well, however he is unable to afford copays.   Emotional Assessment Appearance:  Appears stated age Attitude/Demeanor/Rapport:  Other (Appropriate) Affect (typically observed):  Accepting Orientation:  Oriented to Self, Oriented to Place, Oriented to  Time, Oriented to Situation Alcohol / Substance use:  Tobacco Use Psych involvement (Current and /or in the community):  No (Comment)  Discharge Needs  Concerns to be addressed:  Denies Needs/Concerns at this time Readmission within the last 30 days:  No Current discharge risk:  Chronically ill Barriers to Discharge:  Inadequate or no insurance, Other (unable to afford)   Dede QuerySarah Arcadia Gorgas, LCSW 08/31/2015, 4:16 PM

## 2015-08-31 NOTE — Progress Notes (Signed)
Physical Therapy Treatment Patient Details Name: George Smith MRN: [id number] DOB: 1956-01-10 Today's Date: 08/31/2015    History of Present Illness Pt is a 59 y.o. male presenting to hospital with general weakness, cough, whitish yellowish phlegm, and SOB >2 weeks.  Pt admitted with acute on chronic respiratory failure secondary to R middle lower lobe PNA.  PMH includes COPD, seizures disorder, depression, anxiety, home O2    PT Comments    Pt still unsteady without AD (continue to recommend pt use RW for balance with ambulation d/t this) and pt's O2 desaturated to 86% on room air with activity (increased back to 89-90% on room air with rest which is where pt started at; nursing aware).  Plan for thoracentesis this afternoon.  Will continue to progress pt with balance and ambulation with least restrictive AD per pt tolerance.   Follow Up Recommendations  Home health PT;Supervision/Assistance - 24 hour     Equipment Recommendations  Rolling walker with 5" wheels    Recommendations for Other Services       Precautions / Restrictions Precautions Precautions: Fall Restrictions Weight Bearing Restrictions: No    Mobility  Bed Mobility Overal bed mobility: Modified Independent             General bed mobility comments: Supine to/from sit with HOB elevated  Transfers Overall transfer level: Needs assistance Equipment used: None Transfers: Sit to/from Stand Sit to Stand: Min guard;Min assist         General transfer comment: mild unsteadiness without AD  Ambulation/Gait Ambulation/Gait assistance: Min guard;Min assist Ambulation Distance (Feet): 20 Feet Assistive device: None Gait Pattern/deviations: Step-through pattern Gait velocity: mildly decreased   General Gait Details: unsteady without UE support; limited d/t O2 desaturation   Stairs            Wheelchair Mobility    Modified Rankin (Stroke Patients Only)       Balance Overall balance  assessment: Needs assistance Sitting-balance support: No upper extremity supported;Feet supported Sitting balance-Leahy Scale: Normal     Standing balance support: No upper extremity supported Standing balance-Leahy Scale: Fair                      Cognition Arousal/Alertness: Awake/alert Behavior During Therapy: Agitated;Impulsive Overall Cognitive Status: No family/caregiver present to determine baseline cognitive functioning (Oriented to person, place, time, and situation)                      Exercises      General Comments   Nursing cleared pt for participation in physical therapy.  Pt agreeable to PT session.      Pertinent Vitals/Pain Pain Assessment: No/denies pain    Home Living                      Prior Function            PT Goals (current goals can now be found in the care plan section) Acute Rehab PT Goals Patient Stated Goal: to go home PT Goal Formulation: With patient Time For Goal Achievement: 09/13/15 Potential to Achieve Goals: Good Progress towards PT goals: Progressing toward goals    Frequency  Min 2X/week    PT Plan Current plan remains appropriate    Co-evaluation             End of Session Equipment Utilized During Treatment: Gait belt Activity Tolerance: Other (comment) (Limited d/t O2 desaturation with activity) Patient left:  in bed;with call bell/phone within reach (pt refusing bed alarm; nursing aware)     Time: [id number] PT Time Calculation (min) (ACUTE ONLY): 10 min  Charges:  $Therapeutic Activity: 8-22 mins                    G CodesHendricks Smith:      George Smith 08/31/2015, 10:36 AM George LimesEmily Alicea Smith, PT [phone]

## 2015-08-31 NOTE — Progress Notes (Signed)
Patient refused bed alarm during the night per RN report, educated patient on fall risk prevention including use of bed alarm and calling for assistance. Patient stated he understood, but refused bed alarm. RN aware. Will continue to monitor.

## 2015-08-31 NOTE — Plan of Care (Signed)
Problem: Safety: Goal: Ability to remain free from injury will improve Outcome: Not Progressing Patient is a high fall risk due to fall at home.  He refuses bed alarm and will not call for assistance with toileting.  He is impulsive in getting out of bed without assistance.  He is steady on feet when up.  Bed alarm is on to alert when he leaves bed.  Bed in lowest position with wheels locked.  Call bell is within rech.  Problem: Respiratory: Goal: Respiratory status will improve Outcome: Progressing Patient on Zosyn IVF.  Lung sounds are clear but diminished.  He can have breathing treatments PRN.  He did not request any treatments this shift.

## 2015-09-01 ENCOUNTER — Inpatient Hospital Stay (HOSPITAL_COMMUNITY)
Admit: 2015-09-01 | Discharge: 2015-09-01 | Disposition: A | Discharge status: Home / Self Care | Payer: Medicaid Other | Attending: Internal Medicine | Admitting: Internal Medicine

## 2015-09-01 ENCOUNTER — Inpatient Hospital Stay: Payer: Medicaid Other

## 2015-09-01 DIAGNOSIS — R0609 Other forms of dyspnea: Secondary | ICD-10-CM

## 2015-09-01 DIAGNOSIS — J90 Pleural effusion, not elsewhere classified: Secondary | ICD-10-CM

## 2015-09-01 LAB — CREATININE, SERUM
CREATININE: 1 mg/dL (ref 0.61–1.24)
GFR calc non Af Amer: >60 mL/min (ref >60)

## 2015-09-01 LAB — CBC WITH DIFFERENTIAL/PLATELET
BASOS ABS: 0.1 10*3/uL (ref 0–0.1)
Basophils Relative: 1 %
EOS ABS: 0.2 10*3/uL (ref 0–0.7)
Eosinophils Relative: 2 %
HCT: 38.1 % — ABNORMAL LOW (ref 40.0–52.0)
HEMOGLOBIN: 12.7 g/dL — AB (ref 13.0–18.0)
LYMPHS ABS: 1.5 10*3/uL (ref 1.0–3.6)
LYMPHS PCT: 16 %
MCH: 28.7 pg (ref 26.0–34.0)
MCHC: 33.4 g/dL (ref 32.0–36.0)
MCV: 86 fL (ref 80.0–100.0)
Monocytes Absolute: 0.8 10*3/uL (ref 0.2–1.0)
Monocytes Relative: 9 %
NEUTROS PCT: 72 %
Neutro Abs: 6.5 10*3/uL (ref 1.4–6.5)
Platelets: 250 10*3/uL (ref 150–440)
RBC: 4.43 MIL/uL (ref 4.40–5.90)
RDW: 15.6 % — ABNORMAL HIGH (ref 11.5–14.5)
WBC: 9.1 10*3/uL (ref 3.8–10.6)

## 2015-09-01 LAB — PH, BODY FLUID: pH, Body Fluid: 8

## 2015-09-01 NOTE — Clinical Social Work Note (Signed)
Clinical Social Worker spoke with [facility] regarding pt concerns. Per RN, pt sees Dr. Jerral RalphNeimyer as his PCP. Since pt has Medicaid, not able to assist with copays. CSW encouraged pt to follow up with PCP about ways to minimize copays. CSW is signing off as no further needs identified.   Dede QuerySarah Kyndall Chaplin, MSW, LCSW Clinical Social Worker [phone]

## 2015-09-01 NOTE — Plan of Care (Signed)
Problem: Safety: Goal: Ability to remain free from injury will improve Outcome: Not Progressing Patient is a high fall risk due to fall at home. Patient refused bed alarm this shift.  He is steady on feet when up but will not call for assistance. Bed alarm is on to alert when he leaves bed. Bed in lowest position with wheels locked. Call bell is within reach.  Problem: Respiratory: Goal: Respiratory status will improve Outcome: Progressing Patient continues on Zosyn IVF. Lung sounds are clear but diminished he had inspiratory wheezing before respiratory treatment which resolved after treatment and was not heard for remainder of shift. He had thoracentesis on 11/9 and 1.5L removed.  Patient stated, "my breathing is so much better tonight".

## 2015-09-01 NOTE — Consult Note (Signed)
[facility] [place] Pulmonary Medicine Consultation     Date: 09/01/2015,   MRN# [id number] George Smith [birth year] Code Status:     Code Status Orders        Start     Ordered   08/29/15 1549  Full code   Continuous     08/29/15 1548     Hosp day:@LENGTHOFSTAYDAYS @ Referring MD: @     PCP:      AdmissionWeight: 140 lb (63.504 kg)                 CurrentWeight: 177 lb 6.4 oz (80.468 kg) George Smith is a 59 y.o. old male seen in consultation for pleural effusion.   CC: SOB,cough   HPI    HPI:  59 yo white male with COPD admitted for increasing SOB and WOB, patient had rt sided pleural effusion S/p thoracentesis today-feels much better approx 1500 ml removed patient with previous approx 2 months ago.   Patient currently being treated for pneumonia with para-pneumonic effusion Patient off oxygen resting comfortably   Home Medication:  No current outpatient prescriptions on file.  Current Medication:   Current facility-administered medications:  .  acetaminophen (TYLENOL) tablet 650 mg, 650 mg, Oral, Q6H PRN **OR** acetaminophen (TYLENOL) suppository 650 mg, 650 mg, Rectal, Q6H PRN, Enedina Finner, MD .  albuterol (PROVENTIL) (2.5 MG/3ML) 0.083% nebulizer solution 2.5 mg, 2.5 mg, Nebulization, TID, Enedina Finner, MD, 2.5 mg at 09/01/15 1346 .  enoxaparin (LOVENOX) injection 40 mg, 40 mg, Subcutaneous, Q24H, Enedina Finner, MD, 40 mg at 08/31/15 1629 .  feeding supplement (ENSURE ENLIVE) (ENSURE ENLIVE) liquid 237 mL, 237 mL, Oral, TID WC, Auburn Bilberry, MD, 237 mL at 09/01/15 1219 .  FLUoxetine (PROZAC) capsule 10 mg, 10 mg, Oral, Daily, Enedina Finner, MD, 10 mg at 09/01/15 0935 .  furosemide (LASIX) tablet 40 mg, 40 mg, Oral, Daily, Enedina Finner, MD, 40 mg at 09/01/15 0935 .  HYDROcodone-acetaminophen (NORCO/VICODIN) 5-325 MG per tablet 1-2 tablet, 1-2 tablet, Oral, Q4H PRN, Enedina Finner, MD .  ipratropium-albuterol (DUONEB) 0.5-2.5 (3) MG/3ML nebulizer solution 3 mL, 3  mL, Nebulization, Q4H PRN, Enedina Finner, MD, 3 mL at 08/31/15 2342 .  levETIRAcetam (KEPPRA) tablet 500 mg, 500 mg, Oral, BID, Enedina Finner, MD, 500 mg at 09/01/15 0935 .  levofloxacin (LEVAQUIN) IVPB 750 mg, 750 mg, Intravenous, Q24H, Enedina Finner, MD, 750 mg at 08/31/15 1630 .  mometasone-formoterol (DULERA) 100-5 MCG/ACT inhaler 2 puff, 2 puff, Inhalation, BID, Enedina Finner, MD, 2 puff at 09/01/15 0935 .  ondansetron (ZOFRAN) tablet 4 mg, 4 mg, Oral, Q6H PRN **OR** ondansetron (ZOFRAN) injection 4 mg, 4 mg, Intravenous, Q6H PRN, Enedina Finner, MD .  piperacillin-tazobactam (ZOSYN) IVPB 3.375 g, 3.375 g, Intravenous, 3 times per day, Enedina Finner, MD, 3.375 g at 09/01/15 1415 .  pneumococcal 23 valent vaccine (PNU-IMMUNE) injection 0.5 mL, 0.5 mL, Intramuscular, Tomorrow-1000, Enedina Finner, MD .  senna-docusate (Senokot-S) tablet 1 tablet, 1 tablet, Oral, QHS PRN, Enedina Finner, MD     ALLERGIES   Review of patient's allergies indicates no known allergies.     REVIEW OF SYSTEMS   Review of Systems  Constitutional: Positive for chills and malaise/fatigue. Negative for fever and weight loss.  Respiratory: Positive for cough, shortness of breath and wheezing.   Cardiovascular: Negative for chest pain, palpitations and leg swelling.  Gastrointestinal: Negative for nausea, vomiting and abdominal pain.  Musculoskeletal: Negative for myalgias and neck pain.  Neurological: Negative.   Psychiatric/Behavioral: Negative.  VS: BP 104/75 mmHg  Pulse 109  Temp(Src) 98.1 F (36.7 C) (Oral)  Resp 20  Ht 5\' 5"  (1.651 m)  Wt 177 lb 6.4 oz (80.468 kg)  BMI 29.52 kg/m2  SpO2 92%     PHYSICAL EXAM   Physical Exam  Constitutional: He is oriented to person, place, and time. He appears well-developed and well-nourished. No distress.  HENT:  Mouth/Throat: No oropharyngeal exudate.  Eyes: EOM are normal. Pupils are equal, round, and reactive to light.  Neck: Normal range of motion. Neck supple.    Cardiovascular: Normal rate, regular rhythm and normal heart sounds.   No murmur heard. Pulmonary/Chest: No respiratory distress. He has no wheezes.  Abdominal: Soft. Bowel sounds are normal.  Musculoskeletal: Normal range of motion. He exhibits no edema.  Neurological: He is alert and oriented to person, place, and time.  Skin: Skin is warm. He is not diaphoretic.  Psychiatric: He has a normal mood and affect.        LABS    Recent Labs     09/01/15  0607  HGB  12.7*  HCT  38.1*  MCV  86.0  WBC  9.1  CREATININE  1.00  ,    No results for input(s): PH in the last 72 hours.  Invalid input(s): PCO2, PO2, BASEEXCESS, BASEDEFICITE, TFT    CULTURE RESULTS   Recent Results (from the past 240 hour(s))  Body fluid culture     Status: None (Preliminary result)   Collection Time: 08/31/15  1:13 PM  Result Value Ref Range Status   Specimen Description PLEURAL  Final   Special Requests NONE  Final   Gram Stain PENDING  Incomplete   Culture NO GROWTH < 24 HOURS  Final   Report Status PENDING  Incomplete          IMAGING    Dg Chest 1 View  09/01/2015  CLINICAL DATA:  Short of breath EXAM: CHEST 1 VIEW COMPARISON:  08/31/2015 FINDINGS: Progression of right pleural effusion and right lower lobe airspace disease. Possible pneumonia. Negative for heart failure. Left lung is clear. Left lower lobe density felt to be epicardial fat pad. IMPRESSION: Progression of right pleural effusion and right lower lobe airspace disease, suspicious for pneumonia. Electronically Signed   By: Marlan Palauharles  Clark M.D.   On: 09/01/2015 07:11   Dg Chest 1 View  08/31/2015  CLINICAL DATA:  Status post right-sided thoracentesis EXAM: CHEST 1 VIEW COMPARISON:  Chest radiograph and chest CT August 29, 2015 FINDINGS: There is no demonstrable pneumothorax. There is moderate right effusion remaining, less than on recent prior study. There is patchy consolidation in the right base. On the left, there is a  small pleural effusion with patchy opacity, likely atelectasis, in the left base. Heart size and pulmonary vascularity are normal. No adenopathy. No bone lesions. IMPRESSION: No pneumothorax. Right effusion smaller but not entirely resolved. There is partially loculated right effusion with right base patchy consolidation. Small subpulmonic left effusion with probable atelectasis left base. No change in cardiac silhouette. Electronically Signed   By: Bretta BangWilliam  Woodruff III M.D.   On: 08/31/2015 14:55   [place] Thoracentesis Asp Pleural Space W/img Guide  08/31/2015  CLINICAL DATA:  59 year old male with recurrent symptomatic right pleural effusion EXAM: ULTRASOUND GUIDED RIGHT THORACENTESIS COMPARISON:  None. PROCEDURE: An ultrasound guided thoracentesis was thoroughly discussed with the patient and questions answered. The benefits, risks, alternatives and complications were also discussed. The patient understands and wishes to proceed with the  procedure. Written consent was obtained. Ultrasound was performed to localize and mark an adequate pocket of fluid in the right chest. The area was then prepped and draped in the normal sterile fashion. 1% Lidocaine was used for local anesthesia. Under ultrasound guidance a 6 [place] safety centesis catheter was introduced. Thoracentesis was performed. The catheter was removed and a dressing applied. COMPLICATIONS: None. FINDINGS: A total of approximately 1500 mL of yellow pleural fluid was removed. A fluid sample wassent for laboratory analysis. IMPRESSION: Successful ultrasound guided right thoracentesis yielding 1.5 L of pleural fluid. Electronically Signed   By: Malachy Moan M.D.   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  [facility]  Medical Director [facility] Medical Director [facility] Cardio-Pulmonary Department

## 2015-09-01 NOTE — Progress Notes (Signed)
ANTIBIOTIC CONSULT NOTE - INITIAL  Pharmacy Consult for levofloxacin & piperacillin/tazobactam Indication: HCAP  No Known Allergies  Patient Measurements: Height: 5\' 5"  (165.1 cm) Weight: 177 lb 6.4 oz (80.468 kg) IBW/kg (Calculated) : 61.5  Vital Signs: Temp: 98.1 F (36.7 C) (11/10 0910) Temp Source: Oral (11/10 0910) BP: 104/75 mmHg (11/10 0910) Pulse Rate: 109 (11/10 0910) Intake/Output from previous day: 11/09 0701 - 11/10 0700 In: 440 [P.O.:240; IV Piggyback:200] Out: -  Intake/Output from this shift: Total I/O In: 240 [P.O.:240] Out: -   Labs:  Recent Labs  09/01/15 0607  WBC 9.1  HGB 12.7*  PLT 250  CREATININE 1.00   Estimated Creatinine Clearance: 76.8 mL/min (by C-G formula based on Cr of 1).   Microbiology: Recent Results (from the past 720 hour(s))  Body fluid culture     Status: None (Preliminary result)   Collection Time: 08/31/15  1:13 PM  Result Value Ref Range Status   Specimen Description PLEURAL  Final   Special Requests NONE  Final   Gram Stain PENDING  Incomplete   Culture NO GROWTH < 24 HOURS  Final   Report Status PENDING  Incomplete   Assessment: Pharmacy is dosing levofloxacin and piperacillin/tazobactam in this 59 year old male being treated for HCAP. Patient underwent a thoracentesis on 11/9 and cultures were obtained. No cultures were obtained on admission.   Patient is currently on day #4 of antibiotics with levofloxacin 750 mg IV daily and piperacillin/tazobactam 3.375g IV q8h EI.   Plan:  Follow up culture results   Will continue levofloxacin 750 mg IV daily and piperacillin/tazobactam 3.375 g IV q8h EI per discussion with MD with possible change to PO antibiotics tomorrow.   Pharmacy will continue to monitor and adjust doses as needed.   Keturah BarreMary Parris Cudworth PharmD Clinical Pharmacist 09/01/2015  2:00 PM

## 2015-09-01 NOTE — Plan of Care (Signed)
Problem: Education: Goal: Knowledge of [facility] Education information/materials will improve Outcome: Progressing Instructed and Demonstrated how to use phone to call RN and CNA for Assistance. Verbalized and Demonstrated Understanding.     Problem: Safety: Goal: Ability to remain free from injury will improve Outcome: Progressing Discussed with patient and family regarding high fall risk precautions.  Pt verbalized understanding, but needs reinforcement. Wife verbalized understanding. High fall risk interventions maintained.  Pt remained free of injury.   Problem: Health Behavior/Discharge Planning: Goal: Ability to manage health-related needs will improve Outcome: Progressing Explained importance of taking medication as prescribed. Verbalized understanding. Provided handouts on prescribed medications. Encouraged questions regarding medications. Verbalized understanding.  Problem: Activity: Goal: Ability to tolerate increased activity will improve Outcome: Progressing Ambulates Independently. High fall risk. Refuses Bed Alarm.  Encouraged to increase activity. Refusing to ambulate down hallways. Only ambulating in room.     Problem: Education: Goal: Knowledge of disease or condition will improve Outcome: Progressing Educated on smoking cessation.  Educated on medication compliance.  Problem: Respiratory: Goal: Respiratory status will improve Outcome: Progressing Vital Signs stable. Remains on Room Air.  Remains on Scheduled Inhalers.   Remains on Scheduled Duo-Neb Treatments.   Remains on IV Antibiotics. Goal: Ability to maintain a clear airway will improve Outcome: Progressing Encouraged cough and deep breathe with teach back. Demonstrated successfully by patient.

## 2015-09-01 NOTE — Progress Notes (Signed)
*  PRELIMINARY RESULTS* Echocardiogram 2D Echocardiogram has been performed.  George Smith 09/01/2015, 4:40 PM

## 2015-09-01 NOTE — Progress Notes (Signed)
[facility] [place] at [facility]                                                                                                                                                                                            Patient Demographics   George Smith, is a 59 y.o. male, DOB - 29-Sep-1956, UJW:[id number]  Admit date - 08/29/2015   Admitting Physician Enedina Finner, MD  Outpatient Primary MD for the patient is PROVIDER NOT IN SYSTEM   LOS - 3  Subjective: Patient is breathing is improved, had a thoracentesis yesterday   Review of Systems:   CONSTITUTIONAL: No documented fever. No fatigue, weakness. No weight gain, no weight loss.  EYES: No blurry or double vision.  ENT: No tinnitus. No postnasal drip. No redness of the oropharynx.  RESPIRATORY: Positive cough, no wheeze, no hemoptysis. Positive dyspnea.  CARDIOVASCULAR: No chest pain. No orthopnea. No palpitations. No syncope.  GASTROINTESTINAL: No nausea, no vomiting or diarrhea. No abdominal pain. No melena or hematochezia.  GENITOURINARY: No dysuria or hematuria.  ENDOCRINE: No polyuria or nocturia. No heat or cold intolerance.  HEMATOLOGY: No anemia. No bruising. No bleeding.  INTEGUMENTARY: No rashes. No lesions.  MUSCULOSKELETAL: No arthritis. No swelling. No gout.  NEUROLOGIC: No numbness, tingling, or ataxia. No seizure-type activity.  PSYCHIATRIC: No anxiety. No insomnia. No ADD.    Vitals:   Filed Vitals:   08/31/15 2107 09/01/15 0502 09/01/15 0809 09/01/15 0910  BP: 114/90 115/85  104/75  Pulse: 107 107 103 109  Temp: 97.6 F (36.4 C) 98.5 F (36.9 C)  98.1 F (36.7 C)  TempSrc: Oral Oral  Oral  Resp:      Height:      Weight:      SpO2: 96% 91%  92%    Wt Readings from Last 3 Encounters:  08/30/15 80.468 kg (177 lb 6.4 oz)  08/03/15 73.483 kg (162 lb)  07/19/15 64.864 kg (143 lb)     Intake/Output Summary (Last 24 hours) at 09/01/15 1427 Last data filed at 09/01/15 0900   Gross per 24 hour  Intake    680 ml  Output      0 ml  Net    680 ml    Physical Exam:   GENERAL: Pleasant-appearing in no apparent distress.  HEAD, EYES, EARS, NOSE AND THROAT: Atraumatic, normocephalic. Extraocular muscles are intact. Pupils equal and reactive to light. Sclerae anicteric. No conjunctival injection. No oro-pharyngeal erythema.  NECK: Supple. There is no jugular venous distention. No bruits, no lymphadenopathy, no thyromegaly.  HEART: Regular rate and rhythm,. No murmurs, no rubs, no  clicks.  LUNGS: Diminished breath sounds at the right lung base there is no rhonchi or wheezing ABDOMEN: Soft, flat, nontender, nondistended. Has good bowel sounds. No hepatosplenomegaly appreciated.  EXTREMITIES: No evidence of any cyanosis, clubbing, or peripheral edema.  +2 pedal and radial pulses bilaterally.  NEUROLOGIC: The patient is alert, awake, and oriented x3 with no focal motor or sensory deficits appreciated bilaterally.  SKIN: Moist and warm with no rashes appreciated.  Psych: Not anxious, depressed LN: No inguinal LN enlargement    Antibiotics   Anti-infectives    Start     Dose/Rate Route Frequency Ordered Stop   08/30/15 1600  levofloxacin (LEVAQUIN) IVPB 750 mg     750 mg 100 mL/hr over 90 Minutes Intravenous Every 24 hours 08/29/15 1559     08/29/15 2200  piperacillin-tazobactam (ZOSYN) IVPB 3.375 g     3.375 g 12.5 mL/hr over 240 Minutes Intravenous 3 times per day 08/29/15 1602     08/29/15 1400  metroNIDAZOLE (FLAGYL) tablet 500 mg  Status:  Discontinued     500 mg Oral Every 12 hours 08/29/15 1356 08/29/15 1533   08/29/15 1345  vancomycin (VANCOCIN) IVPB 1000 mg/200 mL premix     1,000 mg 200 mL/hr over 60 Minutes Intravenous  Once 08/29/15 1343 08/29/15 1449   08/29/15 1345  piperacillin-tazobactam (ZOSYN) IVPB 3.375 g     3.375 g 12.5 mL/hr over 240 Minutes Intravenous  Once 08/29/15 1343 08/29/15 1856   08/29/15 1345  levofloxacin (LEVAQUIN) IVPB 750 mg      750 mg 100 mL/hr over 90 Minutes Intravenous  Once 08/29/15 1343 08/29/15 1841      Medications   Scheduled Meds: . albuterol  2.5 mg Nebulization TID  . enoxaparin (LOVENOX) injection  40 mg Subcutaneous Q24H  . feeding supplement (ENSURE ENLIVE)  237 mL Oral TID WC  . FLUoxetine  10 mg Oral Daily  . furosemide  40 mg Oral Daily  . levETIRAcetam  500 mg Oral BID  . levofloxacin (LEVAQUIN) IV  750 mg Intravenous Q24H  . mometasone-formoterol  2 puff Inhalation BID  . piperacillin-tazobactam (ZOSYN)  IV  3.375 g Intravenous 3 times per day  . pneumococcal 23 valent vaccine  0.5 mL Intramuscular Tomorrow-1000   Continuous Infusions:  PRN Meds:.acetaminophen **OR** acetaminophen, HYDROcodone-acetaminophen, ipratropium-albuterol, ondansetron **OR** ondansetron (ZOFRAN) IV, senna-docusate   Data Review:   Micro Results Recent Results (from the past 240 hour(s))  Body fluid culture     Status: None (Preliminary result)   Collection Time: 08/31/15  1:13 PM  Result Value Ref Range Status   Specimen Description PLEURAL  Final   Special Requests NONE  Final   Gram Stain PENDING  Incomplete   Culture NO GROWTH < 24 HOURS  Final   Report Status PENDING  Incomplete    Radiology Reports Dg Eye Foreign Body  08/10/2015  CLINICAL DATA:  Metal working/exposure; clearance prior to MRI EXAM: ORBITS FOR FOREIGN BODY - 2 VIEW COMPARISON:  None. FINDINGS: There is no evidence of metallic foreign body within the orbits. No significant bone abnormality identified. IMPRESSION: No evidence of metallic foreign body within the orbits. Electronically Signed   By: Lupita Raider, M.D.   On: 08/10/2015 10:01   Dg Chest 1 View  09/01/2015  CLINICAL DATA:  Short of breath EXAM: CHEST 1 VIEW COMPARISON:  08/31/2015 FINDINGS: Progression of right pleural effusion and right lower lobe airspace disease. Possible pneumonia. Negative for heart failure. Left lung is clear. Left  lower lobe density felt to be  epicardial fat pad. IMPRESSION: Progression of right pleural effusion and right lower lobe airspace disease, suspicious for pneumonia. Electronically Signed   By: Marlan Palauharles  Clark M.D.   On: 09/01/2015 07:11   Dg Chest 1 View  08/31/2015  CLINICAL DATA:  Status post right-sided thoracentesis EXAM: CHEST 1 VIEW COMPARISON:  Chest radiograph and chest CT August 29, 2015 FINDINGS: There is no demonstrable pneumothorax. There is moderate right effusion remaining, less than on recent prior study. There is patchy consolidation in the right base. On the left, there is a small pleural effusion with patchy opacity, likely atelectasis, in the left base. Heart size and pulmonary vascularity are normal. No adenopathy. No bone lesions. IMPRESSION: No pneumothorax. Right effusion smaller but not entirely resolved. There is partially loculated right effusion with right base patchy consolidation. Small subpulmonic left effusion with probable atelectasis left base. No change in cardiac silhouette. Electronically Signed   By: Bretta BangWilliam  Woodruff III M.D.   On: 08/31/2015 14:55   Dg Chest 2 View  08/29/2015  CLINICAL DATA:  Severe shortness of breath beginning approximately 2 weeks ago, worsening. Initial encounter. EXAM: CHEST  2 VIEW COMPARISON:  None. FINDINGS: The patient has a moderate to moderately large right pleural effusion with extensive airspace disease in the right middle and lower lobes. The left lung is clear. Heart size is scratch the cardiac silhouette is partially is obscured. No pneumothorax identified. IMPRESSION: Right pleural effusion noted extensive airspace disease likely due to pneumonia. Recommend followup to clearing. Electronically Signed   By: Drusilla Kannerhomas  Dalessio M.D.   On: 08/29/2015 13:04   Ct Head Wo Contrast  08/29/2015  CLINICAL DATA:  Status post fall this morning. Hematoma left side of the forehead. Blurry vision left eye. Swelling and redness of the left eye lid. Initial encounter. EXAM: CT HEAD  WITHOUT CONTRAST AND ORBITS WITH CONTRAST TECHNIQUE: Contiguous axial images were obtained from the base of the skull through the vertex without contrast. Multidetector CT imaging of the orbits was performed using the standard protocol with and without intravenous contrast. CONTRAST:  75 mL OMNIPAQUE IOHEXOL 300 MG/ML  SOLN COMPARISON:  None. FINDINGS: CT HEAD FINDINGS There is no evidence of acute intracranial abnormality including hemorrhage, infarct, mass lesion, mass effect, midline shift or abnormal extra-axial fluid collection. No hydrocephalus or pneumocephalus. The calvarium is intact. CT ORBITS FINDINGS The globes are intact and the lenses are located bilaterally. Orbital fat is clear. No soft tissue gas collection or radiopaque foreign body is identified. Extraocular muscles and optic nerves are unremarkable in appearance. Reported hematoma on the left side of the forehead is difficult to visualize on this exam. Mild ethmoid air cell disease is seen. Imaged cranial sinuses are otherwise clear. The mastoid air cells are clear. Major caliber vascular structures demonstrate atherosclerotic vascular disease are otherwise unremarkable. No lymphadenopathy is identified. No facial bone fracture is identified. The mandibular condyles are located. Multiple cavities are partially visualized and periapical lucencies are seen about multiple upper teeth. IMPRESSION: No acute abnormality head or orbits. Hematoma the left side of the forehead is not seen on these exams. Extensive dental disease. Electronically Signed   By: Drusilla Kannerhomas  Dalessio M.D.   On: 08/29/2015 12:42   Ct Chest Wo Contrast  08/29/2015  CLINICAL DATA:  Right pleural effusion EXAM: CT CHEST WITHOUT CONTRAST TECHNIQUE: Multidetector CT imaging of the chest was performed following the standard protocol without IV contrast. COMPARISON:  Chest x-ray earlier today FINDINGS: There  is a moderate right pleural effusion, partially loculated laterally. Small left  pleural effusion which appears free flowing. Right middle and lower lobe airspace disease could reflect atelectasis or infiltrate. Left lung is clear. Heart is mildly enlarged. Scattered coronary artery calcifications and aortic calcifications. No aneurysm. Mildly enlarged mediastinal lymph nodes. Index right peritracheal node has a short axis diameter of 11 mm on image 21. Numerous right paratracheal, pre tracheal, sub carina and prevascular lymph nodes. No axillary or visible hilar adenopathy. Imaging into the upper abdomen shows mild perihepatic and perisplenic ascites. No acute bony abnormality or focal bone lesion. IMPRESSION: Moderate right pleural effusion, partially loculated laterally. Small free-flowing left pleural effusion. Right middle and lower lobe atelectasis or consolidation/ pneumonia. Cardiomegaly, coronary artery disease. Mild mediastinal adenopathy. This could be reactive. This could be followed with repeat CT in 3-6 months. Electronically Signed   By: Charlett Nose M.D.   On: 08/29/2015 14:43   Ct Orbits W/cm  08/29/2015  CLINICAL DATA:  Status post fall this morning. Hematoma left side of the forehead. Blurry vision left eye. Swelling and redness of the left eye lid. Initial encounter. EXAM: CT HEAD WITHOUT CONTRAST AND ORBITS WITH CONTRAST TECHNIQUE: Contiguous axial images were obtained from the base of the skull through the vertex without contrast. Multidetector CT imaging of the orbits was performed using the standard protocol with and without intravenous contrast. CONTRAST:  75 mL OMNIPAQUE IOHEXOL 300 MG/ML  SOLN COMPARISON:  None. FINDINGS: CT HEAD FINDINGS There is no evidence of acute intracranial abnormality including hemorrhage, infarct, mass lesion, mass effect, midline shift or abnormal extra-axial fluid collection. No hydrocephalus or pneumocephalus. The calvarium is intact. CT ORBITS FINDINGS The globes are intact and the lenses are located bilaterally. Orbital fat is clear.  No soft tissue gas collection or radiopaque foreign body is identified. Extraocular muscles and optic nerves are unremarkable in appearance. Reported hematoma on the left side of the forehead is difficult to visualize on this exam. Mild ethmoid air cell disease is seen. Imaged cranial sinuses are otherwise clear. The mastoid air cells are clear. Major caliber vascular structures demonstrate atherosclerotic vascular disease are otherwise unremarkable. No lymphadenopathy is identified. No facial bone fracture is identified. The mandibular condyles are located. Multiple cavities are partially visualized and periapical lucencies are seen about multiple upper teeth. IMPRESSION: No acute abnormality head or orbits. Hematoma the left side of the forehead is not seen on these exams. Extensive dental disease. Electronically Signed   By: Drusilla Kanner M.D.   On: 08/29/2015 12:42   US Thoracentesis Asp Pleural Space W/img Guide  08/31/2015  CLINICAL DATA:  59 year old male with recurrent symptomatic right pleural effusion EXAM: ULTRASOUND GUIDED RIGHT THORACENTESIS COMPARISON:  None. PROCEDURE: An ultrasound guided thoracentesis was thoroughly discussed with the patient and questions answered. The benefits, risks, alternatives and complications were also discussed. The patient understands and wishes to proceed with the procedure. Written consent was obtained. Ultrasound was performed to localize and mark an adequate pocket of fluid in the right chest. The area was then prepped and draped in the normal sterile fashion. 1% Lidocaine was used for local anesthesia. Under ultrasound guidance a 6 [place] safety centesis catheter was introduced. Thoracentesis was performed. The catheter was removed and a dressing applied. COMPLICATIONS: None. FINDINGS: A total of approximately 1500 mL of yellow pleural fluid was removed. A fluid sample wassent for laboratory analysis. IMPRESSION: Successful ultrasound guided right thoracentesis  yielding 1.5 L of pleural fluid. Electronically Signed  By: Malachy Moan M.D.   On: 08/31/2015 15:44     CBC  Recent Labs Lab 08/29/15 0936 09/01/15 0607  WBC 12.2* 9.1  HGB 13.8 12.7*  HCT 42.7 38.1*  PLT 224 250  MCV 86.7 86.0  MCH 28.0 28.7  MCHC 32.3 33.4  RDW 15.4* 15.6*  LYMPHSABS 0.8* 1.5  MONOABS 0.7 0.8  EOSABS 0.0 0.2  BASOSABS 0.0 0.1    Chemistries   Recent Labs Lab 08/29/15 0936 09/01/15 0607  NA 139  --   K 4.8  --   CL 105  --   CO2 24  --   GLUCOSE 122*  --   BUN 22*  --   CREATININE 0.99 1.00  CALCIUM 8.7*  --   AST 38  --   ALT 13*  --   ALKPHOS 77  --   BILITOT 1.5*  --    ------------------------------------------------------------------------------------------------------------------ estimated creatinine clearance is 76.8 mL/min (by C-G formula based on Cr of 1). ------------------------------------------------------------------------------------------------------------------ No results for input(s): HGBA1C in the last 72 hours. ------------------------------------------------------------------------------------------------------------------ No results for input(s): CHOL, HDL, LDLCALC, TRIG, CHOLHDL, LDLDIRECT in the last 72 hours. ------------------------------------------------------------------------------------------------------------------ No results for input(s): TSH, T4TOTAL, T3FREE, THYROIDAB in the last 72 hours.  Invalid input(s): FREET3 ------------------------------------------------------------------------------------------------------------------ No results for input(s): VITAMINB12, FOLATE, FERRITIN, TIBC, IRON, RETICCTPCT in the last 72 hours.  Coagulation profile  Recent Labs Lab 08/29/15 0936  INR 1.24    No results for input(s): DDIMER in the last 72 hours.  Cardiac Enzymes No results for input(s): CKMB, TROPONINI, MYOGLOBIN in the last 168 hours.  Invalid input(s):  CK ------------------------------------------------------------------------------------------------------------------ Invalid input(s): POCBNP    Assessment & Plan   George Smith is a 58 y.o. male with a known history of COPD with ongoing tobacco abuse, history of seizure disorder, depression and anxiety comes to the emergency room with generalized weakness cough with whitish yellowish phlegm and shortness of breath for more than 2 weeks.  1. Sepsis secondary to right middle lobe lower lobe pneumonia associated with large pleural effusion Continue IV vancomycin and and Zosyn Right sided pleural effusion status post thoracentesis appears to be exudative in nature, also has a loculated effusion patient was admitted with similar type of thing last month. I will last pulmonary Zepeda in to see what they feel regarding this loculated fluid.  2. Tobacco abuse in the setting of COPD Patient advised smoking cessation When necessary nebs  3. History of seizure disorder with possible seizure this morning No seizures noted here in the ER Seizure precautions Continue Keppra 500 twice a day  4. Depression and anxiety When necessary Valium continue Prozac  5. DVT prophylaxis subcutaneous Lovenox       Code Status Orders        Start     Ordered   08/29/15 1549  Full code   Continuous     08/29/15 1548           Consults none  DVT Prophylaxis  Heparin    Lab Results  Component Value Date   PLT 250 09/01/2015     Time Spent in minutes   35 minutes Auburn Bilberry M.D on 09/01/2015 at 2:27 PM  Between 7am to 6pm - Pager - [phone]  After 6pm go to [email or web address] - password [email or web address] [facility]  [facility] [place]   Office  [phone]

## 2015-09-02 ENCOUNTER — Inpatient Hospital Stay: Payer: Medicaid Other

## 2015-09-02 LAB — CYTOLOGY - NON PAP

## 2015-09-02 MED ORDER — IOHEXOL 350 MG/ML SOLN
100.0000 mL | Freq: Once | INTRAVENOUS | Status: AC | PRN
  Administered 2015-09-02: 100 mL via INTRAVENOUS

## 2015-09-02 NOTE — Progress Notes (Signed)
Paged and spoke with Dr. Anne HahnWillis about pt HR sustaining >145 for over 5 minutes. Per Dr. Anne HahnWillis, if pt has another episode, perform stat EKG. Will continue to monitor.

## 2015-09-02 NOTE — Progress Notes (Signed)
[facility] [place] [facility]                                                                                                                                                                                            Patient Demographics   George Smith, is a 59 y.o. male, DOB - Oct 10, 1956, ONG:[id number]  Admit date - 08/29/2015   Admitting Physician Enedina Finner, MD  Outpatient Primary MD for the patient is PROVIDER NOT IN SYSTEM   LOS - 4  Subjective:  Patient's breathing is improved, patient had a echocardiogram of the heart which showed severe pulmonary hypertension. CT per PE protocol shows no evidence of PE increasing pleural effusion.  Review of Systems:   CONSTITUTIONAL: No documented fever. No fatigue, weakness. No weight gain, no weight loss.  EYES: No blurry or double vision.  ENT: No tinnitus. No postnasal drip. No redness of the oropharynx.  RESPIRATORY: Positive cough, no wheeze, no hemoptysis. Positive dyspnea.  CARDIOVASCULAR: No chest pain. No orthopnea. No palpitations. No syncope.  GASTROINTESTINAL: No nausea, no vomiting or diarrhea. No abdominal pain. No melena or hematochezia.  GENITOURINARY: No dysuria or hematuria.  ENDOCRINE: No polyuria or nocturia. No heat or cold intolerance.  HEMATOLOGY: No anemia. No bruising. No bleeding.  INTEGUMENTARY: No rashes. No lesions.  MUSCULOSKELETAL: No arthritis. No swelling. No gout.  NEUROLOGIC: No numbness, tingling, or ataxia. No seizure-type activity.  PSYCHIATRIC: No anxiety. No insomnia. No ADD.    Vitals:   Filed Vitals:   09/01/15 2024 09/01/15 2109 09/02/15 0434 09/02/15 0751  BP:  107/71 110/79   Pulse:  111 106   Temp:  98.3 F (36.8 C) 97.5 F (36.4 C)   TempSrc:  Oral Oral   Resp:  20 18   Height:      Weight:      SpO2: 93% 95% 92% 93%    Wt Readings from Last 3 Encounters:  08/30/15 80.468 kg (177 lb 6.4 oz)  08/03/15 73.483 kg (162 lb)  07/19/15 64.864 kg (143 lb)      Intake/Output Summary (Last 24 hours) at 09/02/15 1405 Last data filed at 09/01/15 1700  Gross per 24 hour  Intake    680 ml  Output    450 ml  Net    230 ml    Physical Exam:   GENERAL: Pleasant-appearing in no apparent distress.  HEAD, EYES, EARS, NOSE AND THROAT: Atraumatic, normocephalic. Extraocular muscles are intact. Pupils equal and reactive to light. Sclerae anicteric. No conjunctival injection. No oro-pharyngeal erythema.  NECK: Supple. There is no jugular venous distention. No bruits, no  lymphadenopathy, no thyromegaly.  HEART: Regular rate and rhythm,. No murmurs, no rubs, no clicks.  LUNGS: Diminished breath sounds at the right lung base there is no rhonchi or wheezing ABDOMEN: Soft, flat, nontender, nondistended. Has good bowel sounds. No hepatosplenomegaly appreciated.  EXTREMITIES: No evidence of any cyanosis, clubbing, or peripheral edema.  +2 pedal and radial pulses bilaterally.  NEUROLOGIC: The patient is alert, awake, and oriented x3 with no focal motor or sensory deficits appreciated bilaterally.  SKIN: Moist and warm with no rashes appreciated.  Psych: Not anxious, depressed LN: No inguinal LN enlargement    Antibiotics   Anti-infectives    Start     Dose/Rate Route Frequency Ordered Stop   08/30/15 1600  levofloxacin (LEVAQUIN) IVPB 750 mg     750 mg 100 mL/hr over 90 Minutes Intravenous Every 24 hours 08/29/15 1559     08/29/15 2200  piperacillin-tazobactam (ZOSYN) IVPB 3.375 g  Status:  Discontinued     3.375 g 12.5 mL/hr over 240 Minutes Intravenous 3 times per day 08/29/15 1602 09/02/15 1338   08/29/15 1400  metroNIDAZOLE (FLAGYL) tablet 500 mg  Status:  Discontinued     500 mg Oral Every 12 hours 08/29/15 1356 08/29/15 1533   08/29/15 1345  vancomycin (VANCOCIN) IVPB 1000 mg/200 mL premix     1,000 mg 200 mL/hr over 60 Minutes Intravenous  Once 08/29/15 1343 08/29/15 1449   08/29/15 1345  piperacillin-tazobactam (ZOSYN) IVPB 3.375 g     3.375  g 12.5 mL/hr over 240 Minutes Intravenous  Once 08/29/15 1343 08/29/15 1856   08/29/15 1345  levofloxacin (LEVAQUIN) IVPB 750 mg     750 mg 100 mL/hr over 90 Minutes Intravenous  Once 08/29/15 1343 08/29/15 1841      Medications   Scheduled Meds: . albuterol  2.5 mg Nebulization TID  . enoxaparin (LOVENOX) injection  40 mg Subcutaneous Q24H  . feeding supplement (ENSURE ENLIVE)  237 mL Oral TID WC  . FLUoxetine  10 mg Oral Daily  . furosemide  40 mg Oral Daily  . levETIRAcetam  500 mg Oral BID  . levofloxacin (LEVAQUIN) IV  750 mg Intravenous Q24H  . mometasone-formoterol  2 puff Inhalation BID  . pneumococcal 23 valent vaccine  0.5 mL Intramuscular Tomorrow-1000   Continuous Infusions:  PRN Meds:.acetaminophen **OR** acetaminophen, HYDROcodone-acetaminophen, ipratropium-albuterol, ondansetron **OR** ondansetron (ZOFRAN) IV, senna-docusate   Data Review:   Micro Results Recent Results (from the past 240 hour(s))  Body fluid culture     Status: None (Preliminary result)   Collection Time: 08/31/15  1:13 PM  Result Value Ref Range Status   Specimen Description PLEURAL  Final   Special Requests NONE  Final   Gram Stain   Final    MODERATE RED BLOOD CELLS FEW WBC SEEN NO ORGANISMS SEEN    Culture NO GROWTH 2 DAYS  Final   Report Status PENDING  Incomplete    Radiology Reports Dg Eye Foreign Body  08/10/2015  CLINICAL DATA:  Metal working/exposure; clearance prior to MRI EXAM: ORBITS FOR FOREIGN BODY - 2 VIEW COMPARISON:  None. FINDINGS: There is no evidence of metallic foreign body within the orbits. No significant bone abnormality identified. IMPRESSION: No evidence of metallic foreign body within the orbits. Electronically Signed   By: Lupita Raider, M.D.   On: 08/10/2015 10:01   Dg Chest 1 View  09/01/2015  CLINICAL DATA:  Short of breath EXAM: CHEST 1 VIEW COMPARISON:  08/31/2015 FINDINGS: Progression of right pleural effusion  and right lower lobe airspace disease.  Possible pneumonia. Negative for heart failure. Left lung is clear. Left lower lobe density felt to be epicardial fat pad. IMPRESSION: Progression of right pleural effusion and right lower lobe airspace disease, suspicious for pneumonia. Electronically Signed   By: Marlan Palau M.D.   On: 09/01/2015 07:11   Dg Chest 1 View  08/31/2015  CLINICAL DATA:  Status post right-sided thoracentesis EXAM: CHEST 1 VIEW COMPARISON:  Chest radiograph and chest CT August 29, 2015 FINDINGS: There is no demonstrable pneumothorax. There is moderate right effusion remaining, less than on recent prior study. There is patchy consolidation in the right base. On the left, there is a small pleural effusion with patchy opacity, likely atelectasis, in the left base. Heart size and pulmonary vascularity are normal. No adenopathy. No bone lesions. IMPRESSION: No pneumothorax. Right effusion smaller but not entirely resolved. There is partially loculated right effusion with right base patchy consolidation. Small subpulmonic left effusion with probable atelectasis left base. No change in cardiac silhouette. Electronically Signed   By: Bretta Bang III M.D.   On: 08/31/2015 14:55   Dg Chest 2 View  08/29/2015  CLINICAL DATA:  Severe shortness of breath beginning approximately 2 weeks ago, worsening. Initial encounter. EXAM: CHEST  2 VIEW COMPARISON:  None. FINDINGS: The patient has a moderate to moderately large right pleural effusion with extensive airspace disease in the right middle and lower lobes. The left lung is clear. Heart size is scratch the cardiac silhouette is partially is obscured. No pneumothorax identified. IMPRESSION: Right pleural effusion noted extensive airspace disease likely due to pneumonia. Recommend followup to clearing. Electronically Signed   By: Drusilla Kanner M.D.   On: 08/29/2015 13:04   Ct Head Wo Contrast  08/29/2015  CLINICAL DATA:  Status post fall this morning. Hematoma left side of the  forehead. Blurry vision left eye. Swelling and redness of the left eye lid. Initial encounter. EXAM: CT HEAD WITHOUT CONTRAST AND ORBITS WITH CONTRAST TECHNIQUE: Contiguous axial images were obtained from the base of the skull through the vertex without contrast. Multidetector CT imaging of the orbits was performed using the standard protocol with and without intravenous contrast. CONTRAST:  75 mL OMNIPAQUE IOHEXOL 300 MG/ML  SOLN COMPARISON:  None. FINDINGS: CT HEAD FINDINGS There is no evidence of acute intracranial abnormality including hemorrhage, infarct, mass lesion, mass effect, midline shift or abnormal extra-axial fluid collection. No hydrocephalus or pneumocephalus. The calvarium is intact. CT ORBITS FINDINGS The globes are intact and the lenses are located bilaterally. Orbital fat is clear. No soft tissue gas collection or radiopaque foreign body is identified. Extraocular muscles and optic nerves are unremarkable in appearance. Reported hematoma on the left side of the forehead is difficult to visualize on this exam. Mild ethmoid air cell disease is seen. Imaged cranial sinuses are otherwise clear. The mastoid air cells are clear. Major caliber vascular structures demonstrate atherosclerotic vascular disease are otherwise unremarkable. No lymphadenopathy is identified. No facial bone fracture is identified. The mandibular condyles are located. Multiple cavities are partially visualized and periapical lucencies are seen about multiple upper teeth. IMPRESSION: No acute abnormality head or orbits. Hematoma the left side of the forehead is not seen on these exams. Extensive dental disease. Electronically Signed   By: Drusilla Kanner M.D.   On: 08/29/2015 12:42   Ct Chest Wo Contrast  08/29/2015  CLINICAL DATA:  Right pleural effusion EXAM: CT CHEST WITHOUT CONTRAST TECHNIQUE: Multidetector CT imaging of the chest  was performed following the standard protocol without IV contrast. COMPARISON:  Chest x-ray  earlier today FINDINGS: There is a moderate right pleural effusion, partially loculated laterally. Small left pleural effusion which appears free flowing. Right middle and lower lobe airspace disease could reflect atelectasis or infiltrate. Left lung is clear. Heart is mildly enlarged. Scattered coronary artery calcifications and aortic calcifications. No aneurysm. Mildly enlarged mediastinal lymph nodes. Index right peritracheal node has a short axis diameter of 11 mm on image 21. Numerous right paratracheal, pre tracheal, sub carina and prevascular lymph nodes. No axillary or visible hilar adenopathy. Imaging into the upper abdomen shows mild perihepatic and perisplenic ascites. No acute bony abnormality or focal bone lesion. IMPRESSION: Moderate right pleural effusion, partially loculated laterally. Small free-flowing left pleural effusion. Right middle and lower lobe atelectasis or consolidation/ pneumonia. Cardiomegaly, coronary artery disease. Mild mediastinal adenopathy. This could be reactive. This could be followed with repeat CT in 3-6 months. Electronically Signed   By: Charlett Nose M.D.   On: 08/29/2015 14:43   Ct Angio Chest Pe W/cm &/or Wo Cm  09/02/2015  CLINICAL DATA:  Shortness of breath for 6 months, worse over the past few days. Cough. History of COPD. EXAM: CT ANGIOGRAPHY CHEST WITH CONTRAST TECHNIQUE: Multidetector CT imaging of the chest was performed using the standard protocol during bolus administration of intravenous contrast. Multiplanar CT image reconstructions and MIPs were obtained to evaluate the vascular anatomy. CONTRAST:  OMNIPAQUE IOHEXOL 350 MG/ML SOLN COMPARISON:  Chest radiograph 09/01/2015 and CT 08/29/2015 FINDINGS: Pulmonary arterial opacification is adequate without evidence of emboli. Coarse mitral annular calcifications. Coronary artery calcifications again noted. Heart remains mildly enlarged. Multiple mildly enlarged lymph nodes are again seen throughout the  mediastinum and measure up to 13 mm in short axis, overall minimally larger than on the recent prior chest CT. Moderate-sized right pleural effusion has slightly increased in size from the prior CT and is again noted to be partially loculated laterally and anteriorly. Right lower lobe consolidation has increased from the prior CT, with patchy opacities persisting in the right middle lobe. Small left pleural effusion layers dependently and has slightly increased in size from the prior CT. There is mild overlying dependent atelectasis in the left lower lobe. Several small nodules located peripherally in the left upper lobe are similar to the recent prior CT and measure up to 3 mm in size (for example series 6, images 39, 42, 43, and 49). Visualized portion of the upper abdomen demonstrates small volume ascites, stable to slightly increased from prior. There is diffuse body wall edema. Mild thoracic spondylosis is noted. Review of the MIP images confirms the above findings. IMPRESSION: 1. No evidence of pulmonary emboli. 2. Slightly increased size of moderate, partially loculated right and small, layering left pleural effusions. 3. Increased right lower lobe consolidation and patchy right middle lobe opacities concerning for pneumonia. 4. Mild mediastinal lymphadenopathy, minimally larger than on the recent prior CT. 5. Small left upper lobe lung nodules measuring up to 3 mm in size, most likely infectious/inflammatory. 6. Abdominal ascites. Electronically Signed   By: Sebastian Ache M.D.   On: 09/02/2015 10:29   US Abdomen Complete  09/02/2015  CLINICAL DATA:  Alcohol abuse.  Assess pancreas and liver. EXAM: ULTRASOUND ABDOMEN COMPLETE COMPARISON:  None. FINDINGS: Gallbladder: Gallbladder wall thickening, 4 mm.  No stones. Common bile duct: Diameter: Normal caliber, 3 mm Liver: No focal lesion identified. Within normal limits in parenchymal echogenicity. IVC: No abnormality visualized. Pancreas:  Visualized portion  unremarkable. Spleen: Size and appearance within normal limits. Right Kidney: Length: 12.0 cm. Echogenicity within normal limits. No mass or hydronephrosis visualized. Left Kidney: Length: 12.5 cm. Echogenicity within normal limits. No mass or hydronephrosis visualized. Abdominal aorta: No aneurysm visualized. Other findings: Bilateral pleural effusions, right greater than left. Ascites noted around the liver and spleen. IMPRESSION: Bilateral pleural effusions and mild ascites. Gallbladder wall thickening may be related to intrinsic liver disease or chronic cholecystitis. No visible stones. Electronically Signed   By: Charlett Nose M.D.   On: 09/02/2015 10:05   Ct Orbits W/cm  08/29/2015  CLINICAL DATA:  Status post fall this morning. Hematoma left side of the forehead. Blurry vision left eye. Swelling and redness of the left eye lid. Initial encounter. EXAM: CT HEAD WITHOUT CONTRAST AND ORBITS WITH CONTRAST TECHNIQUE: Contiguous axial images were obtained from the base of the skull through the vertex without contrast. Multidetector CT imaging of the orbits was performed using the standard protocol with and without intravenous contrast. CONTRAST:  75 mL OMNIPAQUE IOHEXOL 300 MG/ML  SOLN COMPARISON:  None. FINDINGS: CT HEAD FINDINGS There is no evidence of acute intracranial abnormality including hemorrhage, infarct, mass lesion, mass effect, midline shift or abnormal extra-axial fluid collection. No hydrocephalus or pneumocephalus. The calvarium is intact. CT ORBITS FINDINGS The globes are intact and the lenses are located bilaterally. Orbital fat is clear. No soft tissue gas collection or radiopaque foreign body is identified. Extraocular muscles and optic nerves are unremarkable in appearance. Reported hematoma on the left side of the forehead is difficult to visualize on this exam. Mild ethmoid air cell disease is seen. Imaged cranial sinuses are otherwise clear. The mastoid air cells are clear. Major caliber  vascular structures demonstrate atherosclerotic vascular disease are otherwise unremarkable. No lymphadenopathy is identified. No facial bone fracture is identified. The mandibular condyles are located. Multiple cavities are partially visualized and periapical lucencies are seen about multiple upper teeth. IMPRESSION: No acute abnormality head or orbits. Hematoma the left side of the forehead is not seen on these exams. Extensive dental disease. Electronically Signed   By: Drusilla Kanner M.D.   On: 08/29/2015 12:42   US Thoracentesis Asp Pleural Space W/img Guide  08/31/2015  CLINICAL DATA:  59 year old male with recurrent symptomatic right pleural effusion EXAM: ULTRASOUND GUIDED RIGHT THORACENTESIS COMPARISON:  None. PROCEDURE: An ultrasound guided thoracentesis was thoroughly discussed with the patient and questions answered. The benefits, risks, alternatives and complications were also discussed. The patient understands and wishes to proceed with the procedure. Written consent was obtained. Ultrasound was performed to localize and mark an adequate pocket of fluid in the right chest. The area was then prepped and draped in the normal sterile fashion. 1% Lidocaine was used for local anesthesia. Under ultrasound guidance a 6 [place] safety centesis catheter was introduced. Thoracentesis was performed. The catheter was removed and a dressing applied. COMPLICATIONS: None. FINDINGS: A total of approximately 1500 mL of yellow pleural fluid was removed. A fluid sample wassent for laboratory analysis. IMPRESSION: Successful ultrasound guided right thoracentesis yielding 1.5 L of pleural fluid. Electronically Signed   By: Malachy Moan M.D.   On: 08/31/2015 15:44     CBC  Recent Labs Lab 08/29/15 0936 09/01/15 0607  WBC 12.2* 9.1  HGB 13.8 12.7*  HCT 42.7 38.1*  PLT 224 250  MCV 86.7 86.0  MCH 28.0 28.7  MCHC 32.3 33.4  RDW 15.4* 15.6*  LYMPHSABS 0.8* 1.5  MONOABS 0.7 0.8  EOSABS 0.0 0.2   BASOSABS 0.0 0.1    Chemistries   Recent Labs Lab 08/29/15 0936 09/01/15 0607  NA 139  --   K 4.8  --   CL 105  --   CO2 24  --   GLUCOSE 122*  --   BUN 22*  --   CREATININE 0.99 1.00  CALCIUM 8.7*  --   AST 38  --   ALT 13*  --   ALKPHOS 77  --   BILITOT 1.5*  --    ------------------------------------------------------------------------------------------------------------------ estimated creatinine clearance is 76.8 mL/min (by C-G formula based on Cr of 1). ------------------------------------------------------------------------------------------------------------------ No results for input(s): HGBA1C in the last 72 hours. ------------------------------------------------------------------------------------------------------------------ No results for input(s): CHOL, HDL, LDLCALC, TRIG, CHOLHDL, LDLDIRECT in the last 72 hours. ------------------------------------------------------------------------------------------------------------------ No results for input(s): TSH, T4TOTAL, T3FREE, THYROIDAB in the last 72 hours.  Invalid input(s): FREET3 ------------------------------------------------------------------------------------------------------------------ No results for input(s): VITAMINB12, FOLATE, FERRITIN, TIBC, IRON, RETICCTPCT in the last 72 hours.  Coagulation profile  Recent Labs Lab 08/29/15 0936  INR 1.24    No results for input(s): DDIMER in the last 72 hours.  Cardiac Enzymes No results for input(s): CKMB, TROPONINI, MYOGLOBIN in the last 168 hours.  Invalid input(s): CK ------------------------------------------------------------------------------------------------------------------ Invalid input(s): POCBNP    Assessment & Plan   George Smith is a 59 y.o. male with a known history of COPD with ongoing tobacco abuse, history of seizure disorder, depression and anxiety comes to the emergency room with generalized weakness cough with whitish  yellowish phlegm and shortness of breath for more than 2 weeks.  1. Sepsis secondary to right middle lobe lower lobe pneumonia associated with large pleural effusion Continue IV Zosyn Right sided pleural effusion status post thoracentesis appears to be exudative in nature, also has a loculated effusion will need CT surgery evaluation with Dr. Inez Catalinaakes for possible decortication.  2. Severe pulmonary hypertension: CT negative for PE I have discussed with Dr. Belia HemanKasa feels that this could be related to underlying sleep apnea COPD recommends continuing current treatment for COPD. Evaluation by Dr. Inez Catalinaakes on Monday for decortication of the loculated effusion.  3. History of seizure disorder with possible seizure this morning No seizures noted here in the ER Seizure precautions Continue Keppra 500 twice a day  4. Depression and anxiety When necessary Valium continue Prozac  5. Ascites: Unclear etiology his ultrasound of abdomen shows no evidence of liver cirrhosis, his albumin is around 3.5. Check hepatitis panel HIV   I have discussed the case with Dr. Belia HemanKasa or pulmonary    Code Status Orders        Start     Ordered   08/29/15 1549  Full code   Continuous     08/29/15 1548           Consults none  DVT Prophylaxis  Heparin    Lab Results  Component Value Date   PLT 250 09/01/2015     Time Spent in minutes   35 minutes Auburn BilberryPATEL, Phuong Hillary M.D on 09/02/2015 at 2:05 PM  Between 7am to 6pm - Pager - [phone]  After 6pm go to [email or web address] - password [email or web address] [facility]  [facility]   Office  [phone]

## 2015-09-02 NOTE — Plan of Care (Signed)
Problem: Education: Goal: Knowledge of [facility] Education information/materials will improve Outcome: Progressing Instructed and Demonstrated how to use phone to call RN and CNA for Assistance. Verbalized and Demonstrated Understanding  Problem: Safety: Goal: Ability to remain free from injury will improve Discussed with patient and family regarding high fall risk precautions.   Pt verbalized understanding, but needs reinforcement. Wife verbalized understanding. High fall risk interventions maintained.   Pt remained free of injury.   Problem: Health Behavior/Discharge Planning: Goal: Ability to manage health-related needs will improve Outcome: Progressing Explained importance of taking medication as prescribed. Verbalized understanding. Provided handouts on prescribed medications. Encouraged questions regarding medications. Verbalized understanding.     Problem: Activity: Goal: Ability to tolerate increased activity will improve Outcome: Progressing Ambulates Independently. High fall risk. Refuses Bed Alarm.   Encouraged to increase activity. Refusing to ambulate down hallways. Only ambulating in room.     Problem: Respiratory: Goal: Respiratory status will improve Outcome: Progressing Vital Signs stable. Remains on Room Air.   Remains on Scheduled Inhalers.    Remains on Scheduled Duo-Neb Treatments.    Remains on IV Antibiotics. Encouraged cough and deep breathe with teach back. Demonstrated successfully by patient.

## 2015-09-02 NOTE — Plan of Care (Addendum)
Problem: Safety: Goal: Ability to remain free from injury will improve Outcome: Not Progressing High fall risk interventions maintained. Patient is a high fall risk due to fall at home. Patient continues to refuse bed alarm. He is steady on feet when up but will not call for assistance. Bed alarm is on to alert when he leaves bed. Bed in lowest position with wheels locked. Call bell is within reach.  Problem: Respiratory: Goal: Respiratory status will improve Outcome: Progressing Patient continues on Zosyn IVF. Lung sounds are diminished but clear.  Previous night wheezing not evident this shift.  Patient has been NPO since midnight for U/S today.   Problem: Activity: Goal: Ability to tolerate increased activity will improve Outcome: Progressing Patient is a high fall risk and refuses bed alarm.  He ambulates independently in room.  This shift he did ambulate 1 time around nurses station.  Tolerated well and oxygen stats stayed in mid 90's.

## 2015-09-03 DIAGNOSIS — I272 Other secondary pulmonary hypertension: Secondary | ICD-10-CM

## 2015-09-03 LAB — BODY FLUID CULTURE: Culture: NO GROWTH

## 2015-09-03 LAB — HEPATITIS PANEL, ACUTE
HCV Ab: <0.1 s/co ratio (ref 0.0–0.9)
Hep A IgM: NEGATIVE
Hep B C IgM: NEGATIVE
Hepatitis B Surface Ag: NEGATIVE

## 2015-09-03 LAB — HIV ANTIBODY (ROUTINE TESTING W REFLEX): HIV Screen 4th Generation wRfx: NONREACTIVE

## 2015-09-03 MED ORDER — SILDENAFIL CITRATE 20 MG PO TABS
20.0000 mg | ORAL_TABLET | Freq: Three times a day (TID) | ORAL | Status: DC
  Administered 2015-09-03 – 2015-09-04 (×3): 20 mg via ORAL
  Filled 2015-09-03 (×6): qty 1

## 2015-09-03 MED ORDER — SODIUM CHLORIDE 0.9 % IJ SOLN
3.0000 mL | Freq: Two times a day (BID) | INTRAMUSCULAR | Status: DC
  Administered 2015-09-03 – 2015-09-04 (×4): 3 mL via INTRAVENOUS

## 2015-09-03 MED ORDER — LEVOFLOXACIN 750 MG PO TABS
750.0000 mg | ORAL_TABLET | Freq: Every day | ORAL | Status: DC
  Administered 2015-09-03 – 2015-09-04 (×2): 750 mg via ORAL
  Filled 2015-09-03 (×2): qty 1

## 2015-09-03 NOTE — Progress Notes (Signed)
[facility] Physicians - [place] at [facility]   PATIENT NAME: George Smith    MR#:  [id number]  DATE OF BIRTH:  December 15, 1955  SUBJECTIVE:   Patient here due to shortness of breath and cough. Patient noted to have pneumonia with a small loculated infusion. Status post thoracentesis and effusion is likely transudative. Echocardiogram showing moderate to severe pulmonary hypertension. Still feels short of breath on exertion. No cough, fever, chills, chest pain or any associated symptoms.  REVIEW OF SYSTEMS:    Review of Systems  Constitutional: Negative for fever and chills.  HENT: Negative for congestion and tinnitus.   Eyes: Negative for blurred vision and double vision.  Respiratory: Positive for shortness of breath (exertional). Negative for cough and wheezing.   Cardiovascular: Negative for chest pain, orthopnea and PND.  Gastrointestinal: Negative for nausea, vomiting, abdominal pain and diarrhea.  Genitourinary: Negative for dysuria and hematuria.  Neurological: Negative for dizziness, sensory change and focal weakness.  All other systems reviewed and are negative.   Nutrition: Soft Tolerating Diet: Yes Tolerating PT: Ambulatory   DRUG ALLERGIES:  No Known Allergies  VITALS:  Blood pressure 115/86, pulse 112, temperature 97.7 F (36.5 C), temperature source Oral, resp. rate 20, height  (1.651 m), weight 80.468 kg (177 lb 6.4 oz), SpO2 90 %.  PHYSICAL EXAMINATION:   Physical Exam  GENERAL:  59 y.o.-year-old patient lying in the bed with no acute distress.  EYES: Pupils equal, round, reactive to light and accommodation. No scleral icterus. Extraocular muscles intact.  HEENT: Head atraumatic, normocephalic. Oropharynx and nasopharynx clear.  NECK:  Supple, no jugular venous distention. No thyroid enlargement, no tenderness.  LUNGS: Normal breath sounds bilaterally, bibasilar rales, no rhonchi, wheezing. No use of accessory muscles of respiration.   CARDIOVASCULAR: S1, S2 normal. No murmurs, rubs, or gallops.  ABDOMEN: Soft, nontender, nondistended. Bowel sounds present. No organomegaly or mass.  EXTREMITIES: No cyanosis, clubbing or edema b/l.    NEUROLOGIC: Cranial nerves II through XII are intact. No focal Motor or sensory deficits b/l.   PSYCHIATRIC: The patient is alert and oriented x 3. Good affect SKIN: No obvious rash, lesion, or ulcer.    LABORATORY PANEL:   CBC  Recent Labs Lab 09/01/15 0607  WBC 9.1  HGB 12.7*  HCT 38.1*  PLT 250   ------------------------------------------------------------------------------------------------------------------  Chemistries   Recent Labs Lab 08/29/15 0936 09/01/15 0607  NA 139  --   K 4.8  --   CL 105  --   CO2 24  --   GLUCOSE 122*  --   BUN 22*  --   CREATININE 0.99 1.00  CALCIUM 8.7*  --   AST 38  --   ALT 13*  --   ALKPHOS 77  --   BILITOT 1.5*  --    ------------------------------------------------------------------------------------------------------------------  Cardiac Enzymes No results for input(s): TROPONINI in the last 168 hours. ------------------------------------------------------------------------------------------------------------------  RADIOLOGY:  Ct Angio Chest Pe W/cm &/or Wo Cm  09/02/2015  CLINICAL DATA:  Shortness of breath for 6 months, worse over the past few days. Cough. History of COPD. EXAM: CT ANGIOGRAPHY CHEST WITH CONTRAST TECHNIQUE: Multidetector CT imaging of the chest was performed using the standard protocol during bolus administration of intravenous contrast. Multiplanar CT image reconstructions and MIPs were obtained to evaluate the vascular anatomy. CONTRAST:  OMNIPAQUE IOHEXOL 350 MG/ML SOLN COMPARISON:  Chest radiograph 09/01/2015 and CT 08/29/2015 FINDINGS: Pulmonary arterial opacification is adequate without evidence of emboli. Coarse mitral annular calcifications.  Coronary artery calcifications again noted. Heart  remains mildly enlarged. Multiple mildly enlarged lymph nodes are again seen throughout the mediastinum and measure up to 13 mm in short axis, overall minimally larger than on the recent prior chest CT. Moderate-sized right pleural effusion has slightly increased in size from the prior CT and is again noted to be partially loculated laterally and anteriorly. Right lower lobe consolidation has increased from the prior CT, with patchy opacities persisting in the right middle lobe. Small left pleural effusion layers dependently and has slightly increased in size from the prior CT. There is mild overlying dependent atelectasis in the left lower lobe. Several small nodules located peripherally in the left upper lobe are similar to the recent prior CT and measure up to 3 mm in size (for example series 6, images 39, 42, 43, and 49). Visualized portion of the upper abdomen demonstrates small volume ascites, stable to slightly increased from prior. There is diffuse body wall edema. Mild thoracic spondylosis is noted. Review of the MIP images confirms the above findings. IMPRESSION: 1. No evidence of pulmonary emboli. 2. Slightly increased size of moderate, partially loculated right and small, layering left pleural effusions. 3. Increased right lower lobe consolidation and patchy right middle lobe opacities concerning for pneumonia. 4. Mild mediastinal lymphadenopathy, minimally larger than on the recent prior CT. 5. Small left upper lobe lung nodules measuring up to 3 mm in size, most likely infectious/inflammatory. 6. Abdominal ascites. Electronically Signed   By: Sebastian Ache M.D.   On: 09/02/2015 10:29   US Abdomen Complete  09/02/2015  CLINICAL DATA:  Alcohol abuse.  Assess pancreas and liver. EXAM: ULTRASOUND ABDOMEN COMPLETE COMPARISON:  None. FINDINGS: Gallbladder: Gallbladder wall thickening, 4 mm.  No stones. Common bile duct: Diameter: Normal caliber, 3 mm Liver: No focal lesion identified. Within normal limits  in parenchymal echogenicity. IVC: No abnormality visualized. Pancreas: Visualized portion unremarkable. Spleen: Size and appearance within normal limits. Right Kidney: Length: 12.0 cm. Echogenicity within normal limits. No mass or hydronephrosis visualized. Left Kidney: Length: 12.5 cm. Echogenicity within normal limits. No mass or hydronephrosis visualized. Abdominal aorta: No aneurysm visualized. Other findings: Bilateral pleural effusions, right greater than left. Ascites noted around the liver and spleen. IMPRESSION: Bilateral pleural effusions and mild ascites. Gallbladder wall thickening may be related to intrinsic liver disease or chronic cholecystitis. No visible stones. Electronically Signed   By: Charlett Nose M.D.   On: 09/02/2015 10:05     ASSESSMENT AND PLAN:   59 y.o. male with a known history of COPD with ongoing tobacco abuse, history of seizure disorder, depression and anxiety comes to the emergency room with generalized weakness cough with whitish yellowish phlegm and shortness of breath for more than 2 weeks.  1. Sepsis secondary to right middle lobe lower lobe pneumonia associated with large pleural effusion -Clinically much improved and now afebrile and hemodynamically stable with normal white cell count. -Status post ultrasound-guided thoracentesis which showed that the fluid was transudative in nature. But it's loculated and I don't think it needs any further intervention presently.  -Initially on IV Zosyn and now on Levaquin and will continue.  2. Severe pulmonary hypertension: This was based on echocardiogram findings showing PA pressures greater than 125 mmHg. Etiology unclear whether this is primary versus secondary pulmonary hypertension. -Clinically patient is symptomatic with ambulation. He likely needs a right heart catheterization for further evaluation of his pulmonary hypertension. Discussed with pulmonary and they have started him on Revatio and will follow  response.   - will need outpatient Pulm. Follow up.   3. Hx of Seizures - no acute seizures.  - Continue Keppra  4. Depression - cont. Prozac.   5. Ascites - suspect this is probably related to the underlying pulmonary hypertension and right-sided heart failure. No evidence of liver disease. -HIV and hepatitis serologies are negative.    All the records are reviewed and case discussed with Care Management/Social Workerr. Management plans discussed with the patient, family and they are in agreement.  CODE STATUS: Full  DVT Prophylaxis: Lovenox  TOTAL TIME TAKING CARE OF THIS PATIENT: 35 minutes.   POSSIBLE D/C IN 1-2 DAYS, DEPENDING ON CLINICAL CONDITION.  Greater than 50% of time spent in coordination of care and discussion with the pulmonologist, patient and nurse.   Houston SirenSAINANI,Randi Poullard J M.D on 09/03/2015 at 2:00 PM  Between 7am to 6pm - Pager - [phone]  After 6pm go to [email or web address] - password [email or web address]  [place] [facility]  Office  [phone]  CC: Primary care physician; PROVIDER NOT IN SYSTEM

## 2015-09-03 NOTE — Progress Notes (Signed)
ANTIBIOTIC CONSULT NOTE - Follow up   Pharmacy Consult for levofloxacin Indication: HCAP  No Known Allergies  Patient Measurements: Height: 5\' 5"  (165.1 cm) Weight: 177 lb 6.4 oz (80.468 kg) IBW/kg (Calculated) : 61.5  Vital Signs: Temp: 98 F (36.7 C) (11/12 0512) Temp Source: Oral (11/12 0512) BP: 116/85 mmHg (11/12 0512) Pulse Rate: 104 (11/12 0512) Intake/Output from previous day: 11/11 0701 - 11/12 0700 In: 270 [P.O.:120; IV Piggyback:150] Out: 1375 [Urine:1375] Intake/Output from this shift: Total I/O In: 240 [P.O.:240] Out: -   Labs:  Recent Labs  09/01/15 0607  WBC 9.1  HGB 12.7*  PLT 250  CREATININE 1.00   Estimated Creatinine Clearance: 76.8 mL/min (by C-G formula based on Cr of 1).   Microbiology: Recent Results (from the past 720 hour(s))  Body fluid culture     Status: None   Collection Time: 08/31/15  1:13 PM  Result Value Ref Range Status   Specimen Description PLEURAL  Final   Special Requests NONE  Final   Gram Stain   Final    MODERATE RED BLOOD CELLS FEW WBC SEEN NO ORGANISMS SEEN    Culture No growth aerobically or anaerobically.  Final   Report Status 09/03/2015 FINAL  Final   Assessment: Pharmacy is dosing levofloxacin and piperacillin/tazobactam in this 59 year old male being treated for HCAP. Patient underwent a thoracentesis on 11/9 and cultures were obtained. No cultures were obtained on admission.   Patient is currently on day #6 of antibiotics with levofloxacin 750 mg IV daily. Previously on Zosyn as well.   Plan:  Follow up culture results  Patient qualifies for IV to PO transition of levofloxacin. Will change to PO levofloxacin 750 mg PO daily.   Demetrius Charityeldrin D. Seryna Marek, PharmD  Clinical Pharmacist 09/03/2015  11:03 AM

## 2015-09-03 NOTE — Progress Notes (Signed)
TC with Dr. Sung AmabileSimonds with sidenafil order verified and desired by MD.

## 2015-09-03 NOTE — Progress Notes (Signed)
TC to Dr. Nevin BloodgoodSianani with pre and post walk HR, Resp, and pulse ox results on room air. Pt ambulated around NS x1 with pt discontinuing walk due to feeling SOB. Respirations increased and HR increased with ambulation with no acute distress. Pulse ox waking varied from 94-95 %RA. MD plans to discharge pt home with outpatient followup.

## 2015-09-03 NOTE — Plan of Care (Signed)
Problem: Activity: Goal: Ability to tolerate increased activity will improve 1. Completed general medical information. 2. Free of injury thus far this shift. Pt has had repeated safety education with pt refusing bed alarm or let staff place bed in low position. Pt compliant with wearing protective footwear when up. Instructed pt to call for help when needs to be up or feels weak/unsteady and stated he would.  3. Compliant with medications. Unable to discharge today with pulmonary consult today desired with sidenafil started for pulmonary hypertension- confirmed with Dr. Sung AmabileSimonds that sidenafil is desired. 4. MD anticipating discharge in next day or two. Will assess for case management referral submission. 5. MD educated pt on right sided heart failure and future procedures needed in future.

## 2015-09-03 NOTE — Progress Notes (Signed)
No new complaints. No distress @ rest. Reports dyspnea with minimal exertion. Denies CP  Filed Vitals:   09/03/15 0800 09/03/15 1206 09/03/15 1209 09/03/15 1342  BP:    115/86  Pulse:  110 117 112  Temp:    97.7 F (36.5 C)  TempSrc:    Oral  Resp:  20 32 20  Height:      Weight:      SpO2: 93% 93% 93% 90%   NAD HEENT WNL + JVD Diminished BS in R base, no adventitious sounds Regular, no M noted Abdomen mildly distended, soft, NT, +BS Ext without edema  BMP Latest Ref Rng 09/01/2015 08/29/2015 07/19/2015  Glucose 65 - 99 mg/dL - 161(W122(H) 81  BUN 6 - 20 mg/dL - 96(E22(H) 22  Creatinine 0.61 - 1.24 mg/dL 4.541.00 0.980.99 1.190.73  Sodium 135 - 145 mmol/L - 139 139  Potassium 3.5 - 5.1 mmol/L - 4.8 4.4  Chloride 101 - 111 mmol/L - 105 101  CO2 22 - 32 mmol/L - 24 27  Calcium 8.9 - 10.3 mg/dL - 8.7(L) 9.3    CBC Latest Ref Rng 09/01/2015 08/29/2015 07/09/2015  WBC 3.8 - 10.6 K/uL 9.1 12.2(H) 9.8  Hemoglobin 13.0 - 18.0 g/dL 12.7(L) 13.8 13.4  Hematocrit 40.0 - 52.0 % 38.1(L) 42.7 39.0  Platelets 150 - 440 K/uL 250 224 189   CT chest 09/02/15:  IMPRESSION: 1. No evidence of pulmonary emboli. 2. Slightly increased size of moderate, partially loculated right and small, layering left pleural effusions. 3. Increased right lower lobe consolidation and patchy right middle lobe opacities concerning for pneumonia. 4. Mild mediastinal lymphadenopathy, minimally larger than on the recent prior CT. 5. Small left upper lobe lung nodules measuring up to 3 mm in size, most likely infectious/inflammatory. 6. Abdominal ascites.   TTE 11/10: LVEF 50-55%. Grade I diastolic dysfunction. LA mildly dilated. RV mildly dilated. RA moderately dilated. RVSP estimated @ 125 mmHg  Abd US 11/11:  IMPRESSION: Bilateral pleural effusions and mild ascites. Gallbladder wall thickening may be related to intrinsic liver disease or chronic cholecystitis. No visible stones  IMPRESSION: 1) Disabling dyspnea 2)  Recurrent R pleural effusion - transudative chemistries by thoracentesis 11/09 3) Severe PAH by echocardiogram - this seems likely to be an overestimation of his true PA pressures. Nonetheless, he has exam and echocardiographic evidence of RV overload and is profoundly dyspneic which is not fully attributable to the pleural effusion. This will require further evaluation as an outpt but it seems reasonable to initiate therapy now  PLAN/REC: - Discussed with Dr Cherlynn KaiserSainani - [facility] - I will arrange for F/U as oupt to complete the evaulation of PAH. This should include RHC which we can arrange with cardiology. Also will need full PFTs. He has has ambulatory oximetry and did not demonstrate any desaturation   Billy Fischeravid Bannie Lobban, MD PCCM service Mobile [phone] Pager [phone]

## 2015-09-04 LAB — CBC WITH DIFFERENTIAL/PLATELET
BASOS ABS: 0.1 10*3/uL (ref 0–0.1)
Basophils Relative: 1 %
EOS PCT: 2 %
Eosinophils Absolute: 0.2 10*3/uL (ref 0–0.7)
HCT: 38.8 % — ABNORMAL LOW (ref 40.0–52.0)
HEMOGLOBIN: 12.7 g/dL — AB (ref 13.0–18.0)
LYMPHS PCT: 10 %
Lymphs Abs: 1 10*3/uL (ref 1.0–3.6)
MCH: 28.4 pg (ref 26.0–34.0)
MCHC: 32.8 g/dL (ref 32.0–36.0)
MCV: 86.7 fL (ref 80.0–100.0)
Monocytes Absolute: 0.9 10*3/uL (ref 0.2–1.0)
Monocytes Relative: 9 %
NEUTROS ABS: 8.2 10*3/uL — AB (ref 1.4–6.5)
Neutrophils Relative %: 78 %
Platelets: 239 10*3/uL (ref 150–440)
RBC: 4.47 MIL/uL (ref 4.40–5.90)
RDW: 15.4 % — ABNORMAL HIGH (ref 11.5–14.5)
WBC: 10.4 10*3/uL (ref 3.8–10.6)

## 2015-09-04 LAB — CREATININE, SERUM
Creatinine, Ser: 0.82 mg/dL (ref 0.61–1.24)
GFR calc non Af Amer: >60 mL/min (ref >60)

## 2015-09-04 MED ORDER — SILDENAFIL CITRATE 20 MG PO TABS: 20.0000 mg | ORAL_TABLET | Freq: Three times a day (TID) | ORAL | Status: AC

## 2015-09-04 MED ORDER — LEVOFLOXACIN 750 MG PO TABS: 750.0000 mg | ORAL_TABLET | Freq: Every day | ORAL | Status: AC

## 2015-09-04 NOTE — Discharge Summary (Signed)
[facility] - [place] at [facility]   PATIENT NAME: George Smith    MR#:  [id number]  DATE OF BIRTH:  1955/12/17  DATE OF ADMISSION:  08/29/2015 ADMITTING PHYSICIAN: Enedina FinnerSona Patel, MD  DATE OF DISCHARGE: 09/04/2015  PRIMARY CARE PHYSICIAN: PROVIDER NOT IN SYSTEM    ADMISSION DIAGNOSIS:  ANUG (acute necrotizing ulcerative gingivitis) [A69.0] Pleural effusion on right [J94.8] HCAP (healthcare-associated pneumonia) [J18.9]  DISCHARGE DIAGNOSIS:  Active Problems:   Sepsis (HCC)   SECONDARY DIAGNOSIS:   Past Medical History  Diagnosis Date  . COPD (chronic obstructive pulmonary disease) (HCC)   . 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 Lung disease     spots on lungs that have not been checked on  . Seizures (HCC) 05/2015  . Heart disease   . Headache     migraines  . Depression   . Anxiety   . Asthma     HOSPITAL COURSE:   59 y.o. male with a known history of COPD with ongoing tobacco abuse, history of seizure disorder, depression and anxiety comes to the emergency room with generalized weakness cough with whitish yellowish phlegm and shortness of breath for more than 2 weeks.  1. Sepsis secondary to right middle lobe lower lobe pneumonia associated with large pleural effusion -Patient was admitted to the hospital and started on IV Zosyn and then weaned to IV Levaquin and has significantly improved since admission. He is afebrile and hemodynamically stable and his sputum production has improved. -He underwent a ultrasound-guided thoracentesis and fluid analysis as well as most likely transudative in nature.  -he currently is being discharged on oral Levaquin for a few more days.  2. Severe pulmonary hypertension: This was based on echocardiogram findings showing PA pressures greater than 125 mmHg. Etiology unclear whether this is primary versus secondary pulmonary hypertension. -Clinically patient is symptomatic with ambulation. He  likely needs a right heart catheterization for further evaluation of his pulmonary hypertension. Patient was advised by pulmonary and started on sildenafil. He's being discharged on that and oxygen at home and he will follow up with pulmonary for further workup of his pulmonary hypertension.  3. Hx of Seizures - patient had no acute seizures while in the hospital.  - He will Continue Keppra  4. Depression - patient will continue Prozac.   5. Ascites - suspect this is probably related to the underlying pulmonary hypertension and right-sided heart failure. No evidence of liver disease. -HIV and hepatitis serologies are negative.   DISCHARGE CONDITIONS:   Stable  CONSULTS OBTAINED:  Treatment Team:  Stephanie AcreVishal Mungal, MD [facility], MD  DRUG ALLERGIES:  No Known Allergies  DISCHARGE MEDICATIONS:   Current Discharge Medication List    START taking these medications   Details  levofloxacin (LEVAQUIN) 750 MG tablet Take 1 tablet (750 mg total) by mouth daily. Qty: 5 tablet, Refills: 0    sildenafil (REVATIO) 20 MG tablet Take 1 tablet (20 mg total) by mouth 3 (three) times daily. Qty: 60 tablet, Refills: 1      CONTINUE these medications which have NOT CHANGED   Details  FLUoxetine (PROZAC) 10 MG capsule Take 1 capsule (10 mg total) by mouth daily. Qty: 30 capsule, Refills: 1    Fluticasone-Salmeterol (ADVAIR) 250-50 MCG/DOSE AEPB Inhale 1 puff into the lungs 2 (two) times daily.    furosemide (LASIX) 40 MG tablet Take 40 mg by mouth daily.    ibuprofen (ADVIL,MOTRIN) 600 MG tablet Take  1 tablet (600 mg total) by mouth every 8 (eight) hours as needed. Qty: 60 tablet, Refills: 1    ipratropium-albuterol (DUONEB) 0.5-2.5 (3) MG/3ML SOLN Take 3 mLs by nebulization every 4 (four) hours as needed. Qty: 360 mL, Refills: 1    levETIRAcetam (KEPPRA) 500 MG tablet Take 1 tablet (500 mg total) by mouth 2 (two) times daily. Qty: 60 tablet, Refills: 12         DISCHARGE  INSTRUCTIONS:   DIET:  Cardiac diet  DISCHARGE CONDITION:  Stable  ACTIVITY:  Activity as tolerated  OXYGEN:  Home Oxygen: Yes.     Oxygen Delivery: 2 liters/min via Patient connected to nasal cannula oxygen  DISCHARGE LOCATION:  home with home health nursing  If you experience worsening of your admission symptoms, develop shortness of breath, life threatening emergency, suicidal or homicidal thoughts you must seek medical attention immediately by calling 911 or calling your MD immediately  if symptoms less severe.  You Must read complete instructions/literature along with all the possible adverse reactions/side effects for all the Medicines you take and that have been prescribed to you. Take any new Medicines after you have completely understood and accpet all the possible adverse reactions/side effects.   Please note  You were cared for by a hospitalist during your hospital stay. If you have any questions about your discharge medications or the care you received while you were in the hospital after you are discharged, you can call the unit and asked to speak with the hospitalist on call if the hospitalist that took care of you is not available. Once you are discharged, your primary care physician will handle any further medical issues. Please note that NO REFILLS for any discharge medications will be authorized once you are discharged, as it is imperative that you return to your primary care physician (or establish a relationship with a primary care physician if you do not have one) for your aftercare needs so that they can reassess your need for medications and monitor your lab values.     Today   Shortness of breath improved but still feels weak. Mostly shortness of breath is on exertion. Ambulated today and O2 sats down to 80% and we'll arrange home O2 prior to discharge.  VITAL SIGNS:  Blood pressure 99/66, pulse 112, temperature 98.6 F (37 C), temperature source Oral,  resp. rate 22, height  (1.651 m), weight 80.468 kg (177 lb 6.4 oz), SpO2 91 %.  I/O:   Intake/Output Summary (Last 24 hours) at 09/04/15 1344 Last data filed at 09/04/15 0800  Gross per 24 hour  Intake    240 ml  Output    775 ml  Net   -535 ml    PHYSICAL EXAMINATION:   GENERAL: 60 y.o.-year-old patient lying in the bed with no acute distress.  EYES: Pupils equal, round, reactive to light and accommodation. No scleral icterus. Extraocular muscles intact.  HEENT: Head atraumatic, normocephalic. Oropharynx and nasopharynx clear.  NECK: Supple, no jugular venous distention. No thyroid enlargement, no tenderness.  LUNGS: Normal breath sounds bilaterally, bibasilar rales, no rhonchi, wheezing. No use of accessory muscles of respiration.  CARDIOVASCULAR: S1, S2 normal. No murmurs, rubs, or gallops.  ABDOMEN: Soft, nontender, nondistended. Bowel sounds present. No organomegaly or mass.  EXTREMITIES: No cyanosis, clubbing or edema b/l.  NEUROLOGIC: Cranial nerves II through XII are intact. No focal Motor or sensory deficits b/l.  PSYCHIATRIC: The patient is alert and oriented x 3. Good affect SKIN: No  obvious rash, lesion, or ulcer.   DATA REVIEW:   CBC  Recent Labs Lab 09/04/15 0533  WBC 10.4  HGB 12.7*  HCT 38.8*  PLT 239    Chemistries   Recent Labs Lab 08/29/15 0936  09/04/15 0533  NA 139  --   --   K 4.8  --   --   CL 105  --   --   CO2 24  --   --   GLUCOSE 122*  --   --   BUN 22*  --   --   CREATININE 0.99  < > 0.82  CALCIUM 8.7*  --   --   AST 38  --   --   ALT 13*  --   --   ALKPHOS 77  --   --   BILITOT 1.5*  --   --   < > = values in this interval not displayed.  Cardiac Enzymes No results for input(s): TROPONINI in the last 168 hours.  Microbiology Results  Results for orders placed or performed during the hospital encounter of 08/29/15  Body fluid culture     Status: None   Collection Time: 08/31/15  1:13 PM  Result Value Ref  Range Status   Specimen Description PLEURAL  Final   Special Requests NONE  Final   Gram Stain   Final    MODERATE RED BLOOD CELLS FEW WBC SEEN NO ORGANISMS SEEN    Culture No growth aerobically or anaerobically.  Final   Report Status 09/03/2015 FINAL  Final    RADIOLOGY:  No results found.    Management plans discussed with the patient, family and they are in agreement.  CODE STATUS:     Code Status Orders        Start     Ordered   08/29/15 1549  Full code   Continuous     08/29/15 1548      TOTAL TIME TAKING CARE OF THIS PATIENT: 40 minutes.    Houston Siren M.D on 09/04/2015 at 1:44 PM  Between 7am to 6pm - Pager - [phone]  After 6pm go to [email or web address] - password [email or web address] [facility]  [place] [facility]  Office  [phone]  CC: Primary care physician; PROVIDER NOT IN SYSTEM

## 2015-09-04 NOTE — Plan of Care (Signed)
Problem: Respiratory: Goal: Ability to maintain a clear airway will improve Outcome: Completed/Met Date Met:  09/04/15 Pt is still having trouble sleeping. Pt denies pain. Pt has been resting comfortably during shift. No other signs of distress noted. Will continue to monitor.

## 2015-09-04 NOTE — Care Management Note (Signed)
Case Management Note  Patient Details  Name: Rolly Pancakehomas W Hage MRN: [id number] Date of Birth: [birth year]  Subjective/Objective:     Request faxed and called to [facility] requesting new oxygen and home health PT and RN. Requested that a portable tank be delivered to Room 124 today and that a new home oxygen setup be provided.                Action/Plan:   Expected Discharge Date:                  Expected Discharge Plan:     In-House Referral:     Discharge planning Services     Post Acute Care Choice:    Choice offered to:     DME Arranged:    DME Agency:     HH Arranged:    HH Agency:     Status of Service:     Medicare Important Message Given:    Date Medicare IM Given:    Medicare IM give by:    Date Additional Medicare IM Given:    Additional Medicare Important Message give by:     If discussed at Long Length of Stay Meetings, dates discussed:    Additional Comments:  Carissa Musick A, RN 09/04/2015, 11:45 AM

## 2015-09-04 NOTE — Progress Notes (Signed)
MD requested pulse ox before and after walking: 1003 HR 109 R 20 Pulse ox 91% RA rest              1005 HR 116 R 22 Pulse ox 89 % RA  post walk              1008 HR 117 R 22 Pulse ox 88% RA  post walk recovery.              1009 HR 112 R 22 Pulse ox 91 % RA  recovered

## 2015-09-04 NOTE — Progress Notes (Signed)
Brother at bedside with 02 delivered. Oral and written AVS instructions done with stated understanding; answered all questions to their satisfaction. Prescriptions given for levaquin and sidenafil with meds given today. [facility] for [facility] home.

## 2015-09-04 NOTE — Progress Notes (Signed)
Discharge home with brother with [id number].

## 2015-09-05 ENCOUNTER — Telehealth: Payer: Self-pay | Admitting: Pulmonary Disease

## 2015-09-05 LAB — LEGIONELLA PNEUMOPHILA SEROGP 1 UR AG: L. pneumophila Serogp 1 Ur Ag: NEGATIVE

## 2015-09-05 NOTE — Telephone Encounter (Signed)
Called patient and scheduled hospital follow up appointment with Dr. Sung AmabileSimonds in [place]. Brother notified of appointment, Nothing further needed. Closing encounter

## 2015-09-06 ENCOUNTER — Telehealth: Payer: Self-pay | Admitting: *Deleted

## 2015-09-06 NOTE — Telephone Encounter (Signed)
Per prior phone note. appt scheduled for 09/23/15. Will sign off

## 2015-09-06 NOTE — Telephone Encounter (Signed)
-----   Message from Merwyn Katosavid B Simonds, MD sent at 09/03/2015  2:36 PM EST ----- Please arrange for office follow up with either me or Dr Belia HemanKasa, next available

## 2015-09-20 ENCOUNTER — Telehealth: Payer: Self-pay | Admitting: Family Medicine

## 2015-09-20 NOTE — Telephone Encounter (Signed)
Patient called and requested med refills for all of the current medications he is taking, Please f/u with pt.

## 2015-09-23 ENCOUNTER — Encounter: Payer: Self-pay | Admitting: Pulmonary Disease

## 2015-09-23 ENCOUNTER — Ambulatory Visit (INDEPENDENT_AMBULATORY_CARE_PROVIDER_SITE_OTHER): Payer: Medicaid Other | Admitting: Pulmonary Disease

## 2015-09-23 VITALS — BP 128/72 | HR 89 | Ht 67.0 in | Wt 153.2 lb

## 2015-09-23 DIAGNOSIS — J90 Pleural effusion, not elsewhere classified: Secondary | ICD-10-CM

## 2015-09-23 DIAGNOSIS — I272 Other secondary pulmonary hypertension: Secondary | ICD-10-CM | POA: Diagnosis not present

## 2015-09-23 DIAGNOSIS — R0600 Dyspnea, unspecified: Secondary | ICD-10-CM

## 2015-09-23 DIAGNOSIS — Z87891 Personal history of nicotine dependence: Secondary | ICD-10-CM

## 2015-09-23 NOTE — Patient Instructions (Signed)
Repeat Chest Xray Lung function tests Blood work Refer to cardiology for right heart catheterization Repeat Echocardiogram on sildenafil (Revatio) Cont Sildenafil (Revatio)

## 2015-09-23 NOTE — Telephone Encounter (Signed)
Patient called and requested med refills for all of his current medications, patient is moving out of state and needs enough medication. Please f/u

## 2015-09-23 NOTE — Progress Notes (Signed)
59 yo M admitted 08/29/15 with the following history: George Smith is a 59 y.o. male with a known history of COPD with ongoing tobacco abuse, history of seizure disorder, depression and anxiety comes to the emergency room with generalized weakness cough with whitish yellowish phlegm and shortness of breath for more than 2 weeks.  He was noted to have R sided infiltrate on CXR and was treated as PNA. HIV was negative. He was seen in consultation 11/10 by Dr Mortimer Fries. A thoracentesis was performed which revealed fluid c/w a transudative process. A CT chest revealed no PE, moderate R pleural effusion, patchy AS dz on the R and a 3 mm nodule in the LUL. He underwent echocardiogram which revealed severe pulmonary hypertension. The RVSP was estimated at > 120 mmHg. The LV function was normal, LA mildly dilated. There was no significant valvular disease. I saw him in follow up on the day after the echocardiogram was performed. At that time (a Saturday), he was reluctant to remain in the hospital to complete the evaluation for newly diagnosed PAH. I recommended that he be discharged on Sildenafil with plans for this follow up.   Presently, he returns with his brother. His brother believes that pt's dyspnea and exercise tolerance are improved. Pt has no new complaints. He denies cough, chest pain, sputum, hemoptysis, LE edema and calf tenderness.    OBJ: Filed Vitals:   09/23/15 0915  BP: 128/72  Pulse: 89  Height: 5' 7"  (1.702 m)  Weight: 153 lb 3.2 oz (69.491 kg)  SpO2: 97%    Gen: poorly kempt in no respiratory distress HEENT:Full beard, no acute abnormalities Neck: NO LAN, JVD cannot be assessed Lungs: dull to percussion with diminished BS approx 1/4 up on R, elsewhere BS are full without adventitious sounds Cardiovascular: Reg rate, prominent S2, normal rhythm, no M noted Abdomen: Soft, NT +BS Ext: no C/C/E Neuro: CNs intact, motor/sens grossly intact  IMPRESSION: 1) Pulmonary hypertension -  likely group 1 2) Pleural effusion - transudative characteristics by fluid chemistries. This is difficult to explain but might be related to RV overload (there is some controversy whether this is a true physiological cause of pleural effusions) 3) Former smoker 4) Dyspnea   PLAN: Repeat CXR to assess pleural effusion that appears to still be present by exam today PFTs ESR, ANA HIV has been checked and is negative LFTs have been checked and are normal RHC - refer to cardiology Repeat Echocardiogram on sildenafil Cont Sildenafil as prescribed previously ROV in 2-3 weeks  Merton Border, MD [facility] service Mobile [phone] Pager [phone]  ADD: after this encounter, the patient informed me that he intends to return to [place]. I implored that we be allowed complete our work up and get him on a well thought out treatment regimen. We agreed to schedule the above tests. However, after I moved to the next room, he informed my staff that he was leaving for [place] that day. The above orders have been cancelled and no follow up has been scheduled  Merton Border, MD [facility] service Mobile [phone] Pager [phone]

## 2015-09-26 NOTE — Telephone Encounter (Signed)
After reviewing chart, this RN feels Dr. Venetia NightAmao needs to make decision regarding refills.  Will route to MD.

## 2015-09-27 NOTE — Telephone Encounter (Signed)
Could you find out if he is still in town and where his meds need to go? Thanks.

## 2015-09-27 NOTE — Telephone Encounter (Signed)
Attempted to call patient-number listed in the chart is for the patient's brother, Gery PrayBarry.  Gery PrayBarry indicated the patient had moved back to [place] and his daughter is supposed to be calling all his MD's here to have records sent.  Brother did not have another number for the patient.  RN told brother that note would be entered into patient chart.

## 2015-10-07 ENCOUNTER — Ambulatory Visit: Payer: Medicaid Other | Admitting: Pulmonary Disease

## 2015-11-07 ENCOUNTER — Ambulatory Visit: Payer: Medicaid Other | Admitting: Diagnostic Neuroimaging

## 2017-07-24 IMAGING — CT CT ANGIO CHEST
2 of 6 series · 18 of 46 positions shown · IV contrast (APPLIED)
Comparison: Chest radiograph 09/01/2015 and CT 08/29/2015

CLINICAL DATA: Shortness of breath for 6 months, worse over the
past few days. Cough. History of COPD.

EXAM:
CT ANGIOGRAPHY CHEST WITH CONTRAST
TECHNIQUE: Multidetector CT imaging of the chest was performed using the
standard protocol during bolus administration of intravenous
contrast. Multiplanar CT image reconstructions and MIPs were
obtained to evaluate the vascular anatomy.
CONTRAST:  100mL OMNIPAQUE IOHEXOL 350 MG/ML SOLN

[Series 5: pe thins 1.5 · axial · 0.68mm/px · z∈[+208,+494]mm · 15 of 268 slices shown]
[im 15/268  lung]
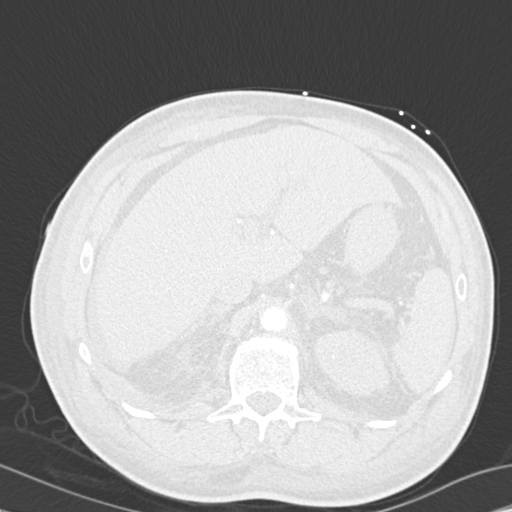
[im 29/268  soft-tissue]
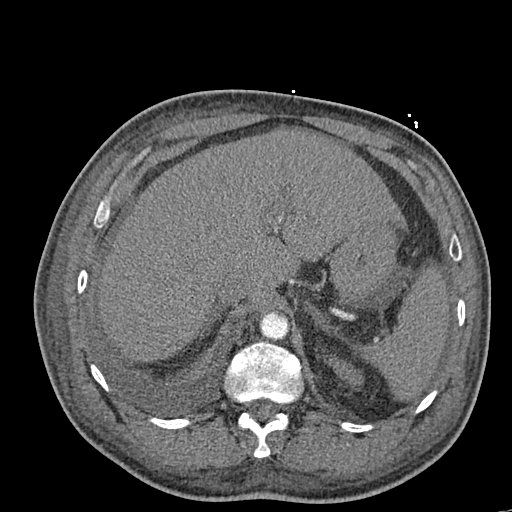
[im 57/268  lung]
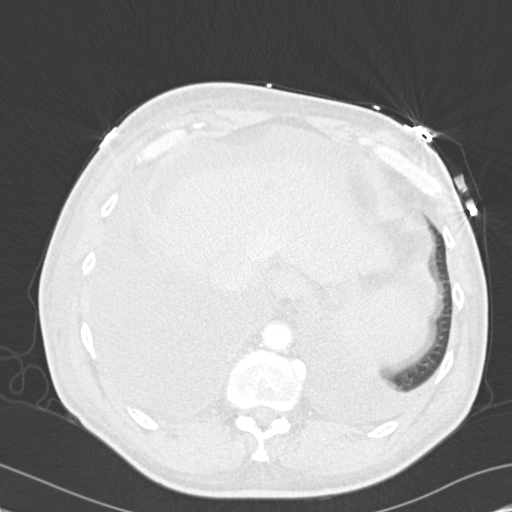
[im 71/268  soft-tissue]
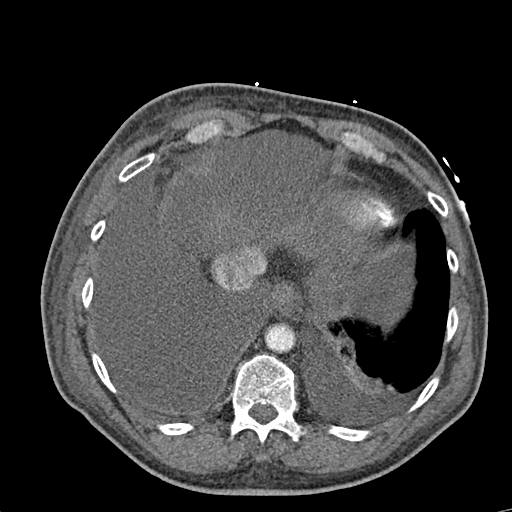
[im 85/268  lung]
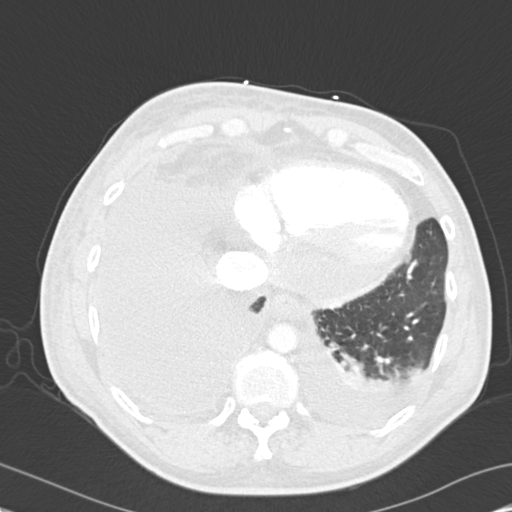
[im 99/268  soft-tissue]
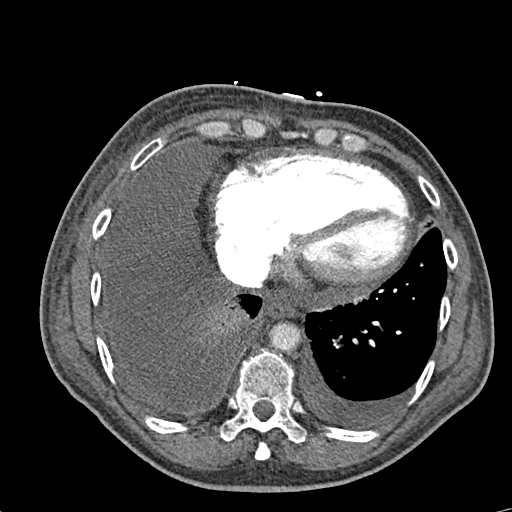
[im 113/268  lung]
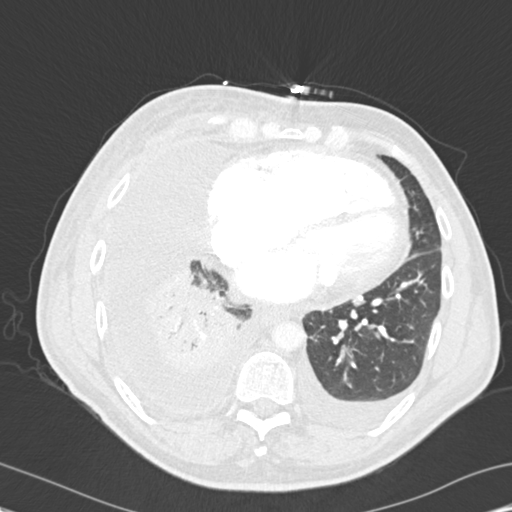
[im 141/268  soft-tissue]
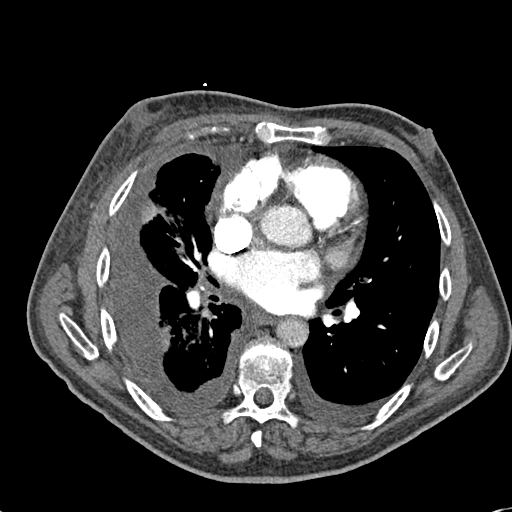
[im 155/268  lung]
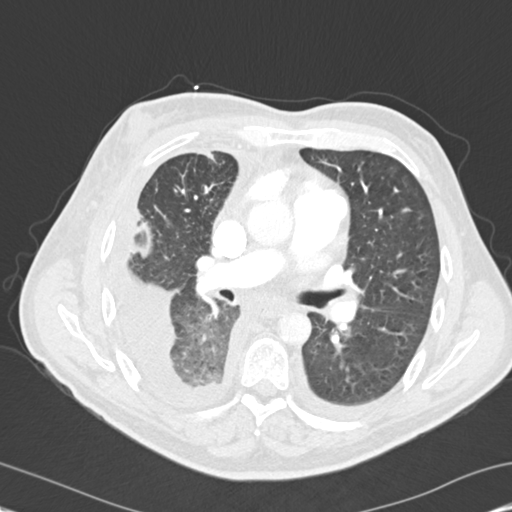
[im 169/268  soft-tissue]
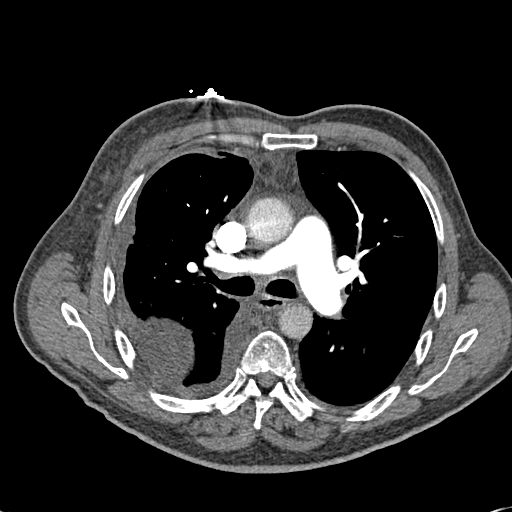
[im 183/268  lung]
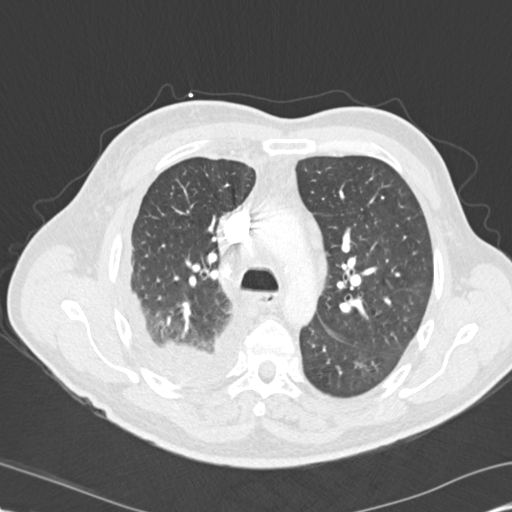
[im 197/268  soft-tissue]
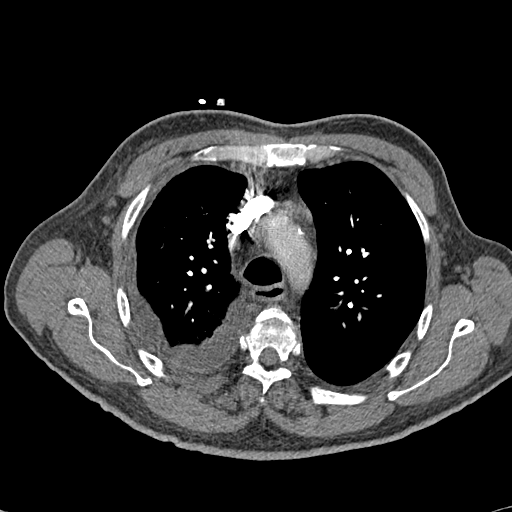
[im 225/268  lung]
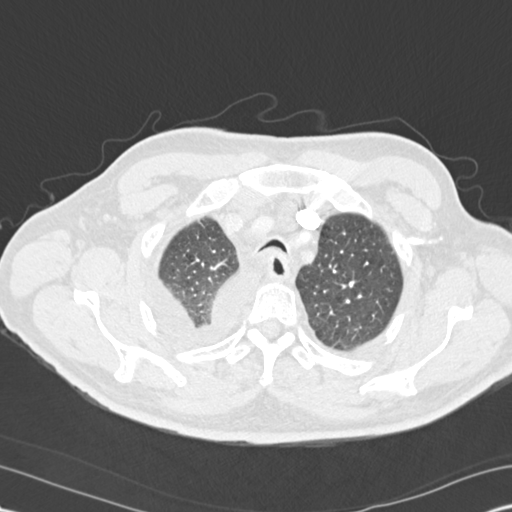
[im 239/268  soft-tissue]
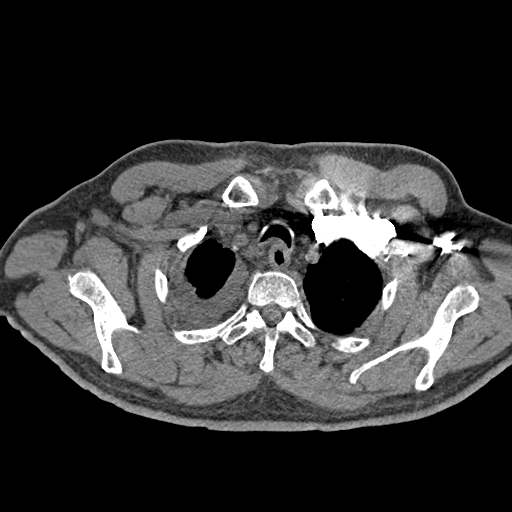
[im 253/268  lung]
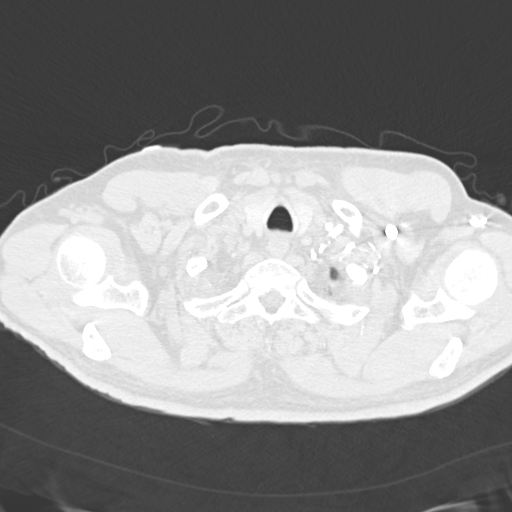

[Series 7: cor mpr 2.0 · coronal · 0.64mm/px · 3 of 129 slices shown]
[im 33/129  soft-tissue]
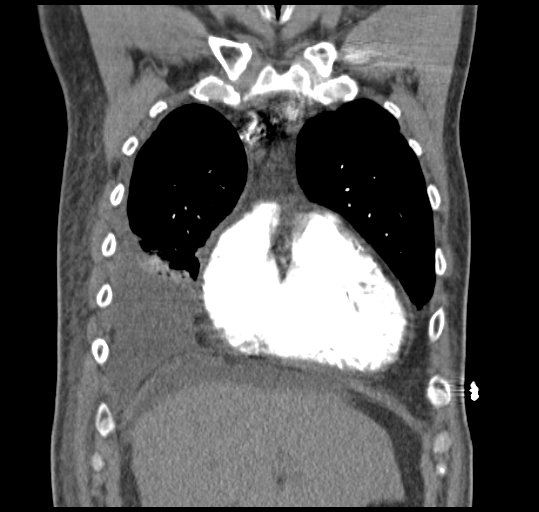
[im 65/129  soft-tissue]
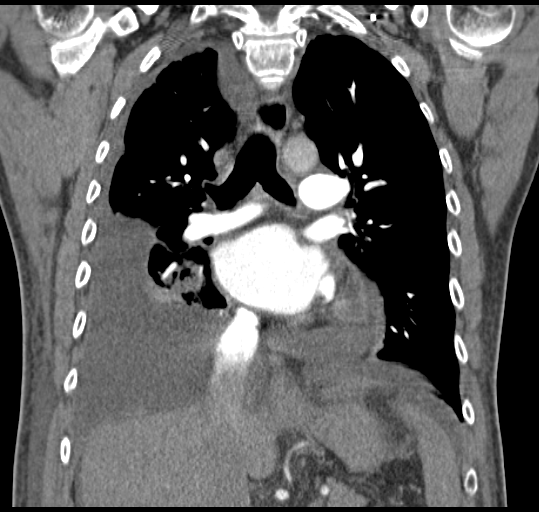
[im 97/129  soft-tissue]
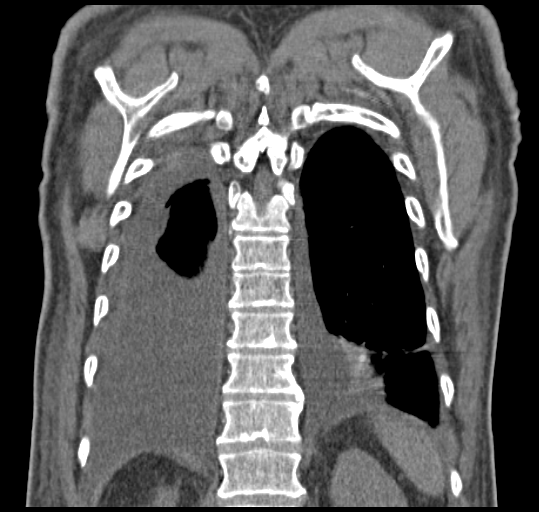

[18 of 46 positions shown; findings below may reference images not displayed]

FINDINGS: Pulmonary arterial opacification is adequate without evidence of
emboli. Coarse mitral annular calcifications. Coronary artery
calcifications again noted. Heart remains mildly enlarged. Multiple
mildly enlarged lymph nodes are again seen throughout the
mediastinum and measure up to 13 mm in short axis, overall minimally
larger than on the recent prior chest CT.

Moderate-sized right pleural effusion has slightly increased in size
from the prior CT and is again noted to be partially loculated
laterally and anteriorly. Right lower lobe consolidation has
increased from the prior CT, with patchy opacities persisting in the
right middle lobe.

Small left pleural effusion layers dependently and has slightly
increased in size from the prior CT. There is mild overlying
dependent atelectasis in the left lower lobe. Several small nodules
located peripherally in the left upper lobe are similar to the
recent prior CT and measure up to 3 mm in size (for example series
6, images 39, 42, 43, and 49).

Visualized portion of the upper abdomen demonstrates small volume
ascites, stable to slightly increased from prior. There is diffuse
body wall edema. Mild thoracic spondylosis is noted.

Review of the MIP images confirms the above findings.
IMPRESSION: 1. No evidence of pulmonary emboli.
2. Slightly increased size of moderate, partially loculated right
and small, layering left pleural effusions.
3. Increased right lower lobe consolidation and patchy right middle
lobe opacities concerning for pneumonia.
4. Mild mediastinal lymphadenopathy, minimally larger than on the
recent prior CT.
5. Small left upper lobe lung nodules measuring up to 3 mm in size,
most likely infectious/inflammatory.
6. Abdominal ascites.

## 2017-12-20 DEATH — deceased
